# Patient Record
Sex: Female | Born: 1975 | Race: Black or African American | Hispanic: No | Marital: Single | Smoking: Current some day smoker
Health system: Southern US, Community
[De-identification: ages and names within clinical notes are randomized; demographics above are authoritative.]

## PROBLEM LIST (undated history)

## (undated) DIAGNOSIS — M546 Pain in thoracic spine: Secondary | ICD-10-CM

## (undated) DIAGNOSIS — M797 Fibromyalgia: Secondary | ICD-10-CM

## (undated) DIAGNOSIS — F250 Schizoaffective disorder, bipolar type: Secondary | ICD-10-CM

## (undated) DIAGNOSIS — M79673 Pain in unspecified foot: Secondary | ICD-10-CM

## (undated) DIAGNOSIS — M549 Dorsalgia, unspecified: Secondary | ICD-10-CM

## (undated) DIAGNOSIS — IMO0002 Reserved for concepts with insufficient information to code with codable children: Secondary | ICD-10-CM

## (undated) DIAGNOSIS — M5430 Sciatica, unspecified side: Secondary | ICD-10-CM

## (undated) DIAGNOSIS — I2510 Atherosclerotic heart disease of native coronary artery without angina pectoris: Secondary | ICD-10-CM

## (undated) DIAGNOSIS — G43909 Migraine, unspecified, not intractable, without status migrainosus: Secondary | ICD-10-CM

## (undated) DIAGNOSIS — G8929 Other chronic pain: Secondary | ICD-10-CM

## (undated) DIAGNOSIS — D571 Sickle-cell disease without crisis: Secondary | ICD-10-CM

## (undated) DIAGNOSIS — E119 Type 2 diabetes mellitus without complications: Secondary | ICD-10-CM

## (undated) DIAGNOSIS — I10 Essential (primary) hypertension: Secondary | ICD-10-CM

## (undated) DIAGNOSIS — M329 Systemic lupus erythematosus, unspecified: Secondary | ICD-10-CM

## (undated) HISTORY — PX: DILATION AND CURETTAGE, DIAGNOSTIC / THERAPEUTIC: SUR384

## (undated) HISTORY — PX: APPENDECTOMY: SHX54

---

## 2011-08-21 DIAGNOSIS — F32A Depression, unspecified: Secondary | ICD-10-CM | POA: Insufficient documentation

## 2011-08-26 DIAGNOSIS — M545 Low back pain, unspecified: Secondary | ICD-10-CM | POA: Insufficient documentation

## 2011-09-10 DIAGNOSIS — E119 Type 2 diabetes mellitus without complications: Secondary | ICD-10-CM | POA: Insufficient documentation

## 2011-09-10 DIAGNOSIS — R519 Headache, unspecified: Secondary | ICD-10-CM | POA: Insufficient documentation

## 2011-10-08 DIAGNOSIS — E785 Hyperlipidemia, unspecified: Secondary | ICD-10-CM | POA: Insufficient documentation

## 2012-09-24 ENCOUNTER — Encounter (HOSPITAL_COMMUNITY): Payer: Self-pay | Admitting: *Deleted

## 2012-09-24 ENCOUNTER — Emergency Department (HOSPITAL_COMMUNITY)
Admission: EM | Admit: 2012-09-24 | Discharge: 2012-09-24 | Disposition: A | Discharge status: Home / Self Care | Payer: Medicaid - Out of State | Attending: Emergency Medicine | Admitting: Emergency Medicine

## 2012-09-24 DIAGNOSIS — M5416 Radiculopathy, lumbar region: Secondary | ICD-10-CM

## 2012-09-24 DIAGNOSIS — Z880 Allergy status to penicillin: Secondary | ICD-10-CM | POA: Insufficient documentation

## 2012-09-24 DIAGNOSIS — IMO0002 Reserved for concepts with insufficient information to code with codable children: Secondary | ICD-10-CM | POA: Insufficient documentation

## 2012-09-24 DIAGNOSIS — I10 Essential (primary) hypertension: Secondary | ICD-10-CM | POA: Insufficient documentation

## 2012-09-24 DIAGNOSIS — E119 Type 2 diabetes mellitus without complications: Secondary | ICD-10-CM | POA: Insufficient documentation

## 2012-09-24 HISTORY — DX: Type 2 diabetes mellitus without complications: E11.9

## 2012-09-24 HISTORY — DX: Essential (primary) hypertension: I10

## 2012-09-24 MED ORDER — NAPROXEN 500 MG PO TABS: 500.0000 mg | ORAL_TABLET | Freq: Two times a day (BID) | ORAL | Status: DC

## 2012-09-24 MED ORDER — CYCLOBENZAPRINE HCL 10 MG PO TABS
10.0000 mg | ORAL_TABLET | Freq: Once | ORAL | Status: AC
  Administered 2012-09-24: 10 mg via ORAL
  Filled 2012-09-24: qty 1

## 2012-09-24 MED ORDER — CYCLOBENZAPRINE HCL 10 MG PO TABS: 10.0000 mg | ORAL_TABLET | Freq: Three times a day (TID) | ORAL | Status: DC | PRN

## 2012-09-24 MED ORDER — KETOROLAC TROMETHAMINE 60 MG/2ML IM SOLN
60.0000 mg | Freq: Once | INTRAMUSCULAR | Status: AC
  Administered 2012-09-24: 60 mg via INTRAMUSCULAR
  Filled 2012-09-24: qty 2

## 2012-09-24 MED ORDER — HYDROCODONE-ACETAMINOPHEN 5-325 MG PO TABS: ORAL_TABLET | ORAL | Status: DC

## 2012-09-24 MED ORDER — OXYCODONE-ACETAMINOPHEN 5-325 MG PO TABS
1.0000 | ORAL_TABLET | Freq: Once | ORAL | Status: AC
  Administered 2012-09-24: 1 via ORAL
  Filled 2012-09-24: qty 1

## 2012-09-24 NOTE — ED Notes (Signed)
Pt c/o mid back pain. States she went to the bathroom and heard something pop and says she cant stand up straight. Pt is using a cane.

## 2012-09-24 NOTE — ED Notes (Signed)
Pain T spine region, onset this pm , pt was on toilet and when standing had pain in her back and felt a "pop" , alert, Pain from left side of back and down to left leg.

## 2012-09-24 NOTE — ED Provider Notes (Signed)
History     CSN: [id number]  Arrival date & time 09/24/12  2130   First MD Initiated Contact with Patient 09/24/12 2203      Chief Complaint  Patient presents with  . Back Pain    (Consider location/radiation/quality/duration/timing/severity/associated sxs/prior treatment) HPI Comments: Christy Velasquez is a 37 y.o. female who presents to the Emergency Department complaining of left sided low back pain.  States that the pain began suddenly earlier this evening while sitting on the toilet.  Felt a sharp pain to her back that radiates int her left buttocks and down her left leg. Describes the pain as sharp and worse with attempted movement.  Improves with sitting and leaning forward slightly.  She denies known injury, fever, abd pain, dysuria, incontinence of bladder or bowel, perineal pain or numbness.    The history is provided by the patient.    Past Medical History  Diagnosis Date  . Hypertension   . Diabetes mellitus without complication     Past Surgical History  Procedure Laterality Date  . Appendectomy      History reviewed. No pertinent family history.  History  Substance Use Topics  . Smoking status: Never Smoker   . Smokeless tobacco: Not on file  . Alcohol Use: No    OB History   Grav Para Term Preterm Abortions TAB SAB Ect Mult Living                  Review of Systems  Constitutional: Negative for fever.  Respiratory: Negative for chest tightness and shortness of breath.   Cardiovascular: Negative for chest pain.  Gastrointestinal: Negative for nausea, vomiting, abdominal pain, constipation and abdominal distention.  Genitourinary: Negative for dysuria, frequency, hematuria, flank pain, decreased urine volume, vaginal bleeding, vaginal discharge, difficulty urinating and pelvic pain.       No perineal numbness or incontinence of urine or feces  Musculoskeletal: Positive for back pain. Negative for joint swelling.  Skin: Negative for rash and wound.    Neurological: Negative for facial asymmetry, weakness and numbness.  All other systems reviewed and are negative.    Allergies  Penicillins and Shellfish allergy  Home Medications   Current Outpatient Rx  Name  Route  Sig  Dispense  Refill  . acetaminophen (ARTHRITIS PAIN RELIEF) 650 MG CR tablet   Oral   Take 650 mg by mouth every 8 (eight) hours as needed for pain.         Marland Kitchen OVER THE COUNTER MEDICATION   Topical   Apply 1 application topically daily as needed (ANALGESIC CREAM FOR PAIN RELIEF).           BP 127/68  Pulse 89  Temp(Src) 99 F (37.2 C)  Resp 20  Ht 5' 5.5" (1.664 m)  Wt 204 lb (92.534 kg)  BMI 33.42 kg/m2  SpO2 99%  Physical Exam  Nursing note and vitals reviewed. Constitutional: She is oriented to person, place, and time. She appears well-developed and well-nourished. No distress.  HENT:  Head: Normocephalic and atraumatic.  Neck: Normal range of motion. Neck supple.  Cardiovascular: Normal rate, regular rhythm, normal heart sounds and intact distal pulses.   No murmur heard. Pulmonary/Chest: Effort normal and breath sounds normal. No respiratory distress. She exhibits no tenderness.  Abdominal: Soft. She exhibits no distension. There is no tenderness. There is no rebound and no guarding.  Musculoskeletal: She exhibits tenderness. She exhibits no edema.       Lumbar back: She exhibits tenderness and  pain. She exhibits normal range of motion, no swelling, no deformity, no laceration and normal pulse.       Back:  ttp of the left lumbar paraspinal muscles and SI joint.  No spinal tenderness, no CVA tenderness.  DP pulses are brisk and symmetrical.  Distal sensation intact.  Hip Flexors/Extensors are intact  Neurological: She is alert and oriented to person, place, and time. No cranial nerve deficit or sensory deficit. She exhibits normal muscle tone. Coordination and gait normal.  Reflex Scores:      Patellar reflexes are 2+ on the right side and  2+ on the left side.      Achilles reflexes are 2+ on the right side and 2+ on the left side. Skin: Skin is warm and dry.    ED Course  Procedures (including critical care time)  Labs Reviewed - No data to display No results found.      MDM     2255  Patient is feeling better.  Pain has improved.  No focal neuro or motor deficits.  Ambulates well.  Doubt emergent neurological or infectious process.  Likely sciatica with muscle spasm.  Will treat with flexeril, vicodin and naprosyn.  VSS.  Patient has appt with her PMD next week.  Doubt emergent neurological or infectious process.       Christy Velasquez L. Trisha Mangle, PA-C 09/25/12 0037

## 2012-09-25 NOTE — ED Provider Notes (Signed)
Medical screening examination/treatment/procedure(s) were performed by non-physician practitioner and as supervising physician I was immediately available for consultation/collaboration.  Alaina Donati, MD 09/25/12 1510 

## 2012-10-05 DIAGNOSIS — G43909 Migraine, unspecified, not intractable, without status migrainosus: Secondary | ICD-10-CM | POA: Insufficient documentation

## 2012-12-05 ENCOUNTER — Emergency Department (HOSPITAL_COMMUNITY)
Admission: EM | Admit: 2012-12-05 | Discharge: 2012-12-05 | Disposition: A | Discharge status: Home / Self Care | Payer: Medicaid - Out of State | Attending: Emergency Medicine | Admitting: Emergency Medicine

## 2012-12-05 ENCOUNTER — Encounter (HOSPITAL_COMMUNITY): Payer: Self-pay | Admitting: Emergency Medicine

## 2012-12-05 ENCOUNTER — Emergency Department (HOSPITAL_COMMUNITY): Payer: Medicaid - Out of State

## 2012-12-05 DIAGNOSIS — I10 Essential (primary) hypertension: Secondary | ICD-10-CM | POA: Insufficient documentation

## 2012-12-05 DIAGNOSIS — Z87828 Personal history of other (healed) physical injury and trauma: Secondary | ICD-10-CM | POA: Insufficient documentation

## 2012-12-05 DIAGNOSIS — M79671 Pain in right foot: Secondary | ICD-10-CM

## 2012-12-05 DIAGNOSIS — M25579 Pain in unspecified ankle and joints of unspecified foot: Secondary | ICD-10-CM | POA: Insufficient documentation

## 2012-12-05 DIAGNOSIS — Z8739 Personal history of other diseases of the musculoskeletal system and connective tissue: Secondary | ICD-10-CM | POA: Insufficient documentation

## 2012-12-05 DIAGNOSIS — G43909 Migraine, unspecified, not intractable, without status migrainosus: Secondary | ICD-10-CM | POA: Insufficient documentation

## 2012-12-05 DIAGNOSIS — E119 Type 2 diabetes mellitus without complications: Secondary | ICD-10-CM | POA: Insufficient documentation

## 2012-12-05 DIAGNOSIS — Z79899 Other long term (current) drug therapy: Secondary | ICD-10-CM | POA: Insufficient documentation

## 2012-12-05 DIAGNOSIS — F17200 Nicotine dependence, unspecified, uncomplicated: Secondary | ICD-10-CM | POA: Insufficient documentation

## 2012-12-05 DIAGNOSIS — Z880 Allergy status to penicillin: Secondary | ICD-10-CM | POA: Insufficient documentation

## 2012-12-05 HISTORY — DX: Sciatica, unspecified side: M54.30

## 2012-12-05 HISTORY — DX: Migraine, unspecified, not intractable, without status migrainosus: G43.909

## 2012-12-05 MED ORDER — NAPROXEN 500 MG PO TABS: 500.0000 mg | ORAL_TABLET | Freq: Two times a day (BID) | ORAL | Status: DC

## 2012-12-05 MED ORDER — TRAMADOL HCL 50 MG PO TABS: 50.0000 mg | ORAL_TABLET | Freq: Four times a day (QID) | ORAL | Status: DC | PRN
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
## 2012-12-05 NOTE — ED Provider Notes (Signed)
CSN: [id number]     Arrival date & time 12/05/12  1110 History   First MD Initiated Contact with Patient 12/05/12 1133     Chief Complaint  Patient presents with  . Foot Pain   (Consider location/radiation/quality/duration/timing/severity/associated sxs/prior Treatment) Patient is a 37 y.o. female presenting with lower extremity pain. The history is provided by the patient.  Foot Pain This is a chronic problem. The current episode started more than 1 month ago. The problem occurs constantly. The problem has been unchanged. Associated symptoms include arthralgias. Pertinent negatives include no chills, fever, headaches, joint swelling, myalgias, nausea, neck pain, numbness, rash, sore throat, vomiting or weakness. The symptoms are aggravated by standing and walking. She has tried NSAIDs and acetaminophen for the symptoms. The treatment provided no relief.    Past Medical History  Diagnosis Date  . Hypertension   . Diabetes mellitus without complication   . Migraines   . Sciatica    Past Surgical History  Procedure Laterality Date  . Appendectomy    . Dilation and curettage, diagnostic / therapeutic     Family History  Problem Relation Age of Onset  . Diabetes Mother   . Heart failure Mother   . Hypertension Mother   . Diabetes Brother   . Diabetes Other   . Heart failure Other    History  Substance Use Topics  . Smoking status: Current Some Day Smoker -- 0.10 packs/day    Types: Cigarettes  . Smokeless tobacco: Not on file  . Alcohol Use: No   OB History   Grav Para Term Preterm Abortions TAB SAB Ect Mult Living   2 1 1  1  1   1      Review of Systems  Constitutional: Negative for fever and chills.  HENT: Negative for sore throat and neck pain.   Gastrointestinal: Negative for nausea and vomiting.  Genitourinary: Negative for dysuria and difficulty urinating.  Musculoskeletal: Positive for arthralgias. Negative for myalgias and joint swelling.  Skin: Negative for  color change, rash and wound.  Neurological: Negative for weakness, numbness and headaches.  All other systems reviewed and are negative.    Allergies  Penicillins and Shellfish allergy  Home Medications   Current Outpatient Rx  Name  Route  Sig  Dispense  Refill  . metFORMIN (GLUCOPHAGE) 500 MG tablet   Oral   Take 500 mg by mouth 2 (two) times daily with a meal.         . topiramate (TOPAMAX) 50 MG tablet   Oral   Take 10 mg by mouth 2 (two) times daily.         Marland Kitchen acetaminophen (ARTHRITIS PAIN RELIEF) 650 MG CR tablet   Oral   Take 650 mg by mouth every 8 (eight) hours as needed for pain.         . cyclobenzaprine (FLEXERIL) 10 MG tablet   Oral   Take 1 tablet (10 mg total) by mouth 3 (three) times daily as needed for muscle spasms.   21 tablet   0   . HYDROcodone-acetaminophen (NORCO/VICODIN) 5-325 MG per tablet      Take one-two tabs po q 4-6 hrs prn pain   20 tablet   0   . naproxen (NAPROSYN) 500 MG tablet   Oral   Take 1 tablet (500 mg total) by mouth 2 (two) times daily.   20 tablet   0   . OVER THE COUNTER MEDICATION   Topical   Apply 1  application topically daily as needed (ANALGESIC CREAM FOR PAIN RELIEF).          BP 144/94  Pulse 87  Temp(Src) 97.9 F (36.6 C) (Oral)  Resp 20  Ht 5' 5.6" (1.666 m)  Wt 208 lb (94.348 kg)  BMI 33.99 kg/m2  SpO2 100% Physical Exam  Nursing note and vitals reviewed. Constitutional: She is oriented to person, place, and time. She appears well-developed and well-nourished. No distress.  HENT:  Head: Normocephalic and atraumatic.  Cardiovascular: Normal rate, regular rhythm, normal heart sounds and intact distal pulses.   No murmur heard. Pulmonary/Chest: Effort normal and breath sounds normal. No respiratory distress.  Musculoskeletal: She exhibits tenderness. She exhibits no edema.  ttp of the dorsal and plantar surface of mid right foot.  ROM is preserved.  DP pulse is brisk,distal sensation intact.   No erythema, edema, bruising or bony deformity.  No proximal tenderness.  Neurological: She is alert and oriented to person, place, and time. She exhibits normal muscle tone. Coordination normal.  Skin: Skin is warm and dry.    ED Course  Procedures (including critical care time) Labs Review Labs Reviewed - No data to display Imaging Review Dg Foot Complete Right  12/05/2012   *RADIOLOGY REPORT*  Clinical Data: Pain  RIGHT FOOT COMPLETE - 3+ VIEW  Comparison: None.  Findings: No acute fracture or dislocation is noted.  Mild hallux valgus deformity is seen.  No gross soft tissue changes are noted.  IMPRESSION: No acute abnormalities seen.   Original Report Authenticated By: Alcide Clever, M.D.    MDM    Crutches, post op shoe applied.  Pain improved, remains NV intact  Referral given for orthopedics.    Leatta Alewine L. Trisha Mangle, PA-C 12/09/12 1727

## 2012-12-07 ENCOUNTER — Telehealth: Payer: Self-pay | Admitting: Orthopedic Surgery

## 2012-12-07 NOTE — Telephone Encounter (Signed)
Patient called following visit to [facility] Emergency Room for foot problem (no fracture).  Reviewed instructions per physician at Emergency department visit; patient's insurance is out of state Medicaid, which I explained we are non-network provider, therefore, unable to schedule and file insurance, as it would be non-covered.  Gave options to patient of [place] orthopedic providers, which she will contact.

## 2012-12-10 NOTE — ED Provider Notes (Signed)
Medical screening examination/treatment/procedure(s) were performed by non-physician practitioner and as supervising physician I was immediately available for consultation/collaboration.   Zakiyyah Savannah L Zulay Corrie, MD 12/10/12 1542 

## 2013-07-07 DIAGNOSIS — Z0000 Encounter for general adult medical examination without abnormal findings: Secondary | ICD-10-CM | POA: Insufficient documentation

## 2013-11-19 ENCOUNTER — Encounter (HOSPITAL_COMMUNITY): Payer: Self-pay | Admitting: Emergency Medicine

## 2013-11-19 ENCOUNTER — Emergency Department (HOSPITAL_COMMUNITY)
Admission: EM | Admit: 2013-11-19 | Discharge: 2013-11-19 | Disposition: A | Discharge status: Home / Self Care | Payer: Medicaid - Out of State | Attending: Emergency Medicine | Admitting: Emergency Medicine

## 2013-11-19 DIAGNOSIS — E669 Obesity, unspecified: Secondary | ICD-10-CM | POA: Diagnosis not present

## 2013-11-19 DIAGNOSIS — Z791 Long term (current) use of non-steroidal anti-inflammatories (NSAID): Secondary | ICD-10-CM | POA: Insufficient documentation

## 2013-11-19 DIAGNOSIS — M545 Low back pain, unspecified: Secondary | ICD-10-CM | POA: Diagnosis not present

## 2013-11-19 DIAGNOSIS — G43909 Migraine, unspecified, not intractable, without status migrainosus: Secondary | ICD-10-CM | POA: Diagnosis not present

## 2013-11-19 DIAGNOSIS — Z79899 Other long term (current) drug therapy: Secondary | ICD-10-CM | POA: Insufficient documentation

## 2013-11-19 DIAGNOSIS — M546 Pain in thoracic spine: Secondary | ICD-10-CM | POA: Diagnosis not present

## 2013-11-19 DIAGNOSIS — I10 Essential (primary) hypertension: Secondary | ICD-10-CM | POA: Insufficient documentation

## 2013-11-19 DIAGNOSIS — G8929 Other chronic pain: Secondary | ICD-10-CM | POA: Diagnosis not present

## 2013-11-19 DIAGNOSIS — Z862 Personal history of diseases of the blood and blood-forming organs and certain disorders involving the immune mechanism: Secondary | ICD-10-CM | POA: Diagnosis not present

## 2013-11-19 DIAGNOSIS — Z880 Allergy status to penicillin: Secondary | ICD-10-CM | POA: Insufficient documentation

## 2013-11-19 DIAGNOSIS — F17200 Nicotine dependence, unspecified, uncomplicated: Secondary | ICD-10-CM | POA: Diagnosis not present

## 2013-11-19 DIAGNOSIS — E119 Type 2 diabetes mellitus without complications: Secondary | ICD-10-CM | POA: Diagnosis not present

## 2013-11-19 HISTORY — DX: Systemic lupus erythematosus, unspecified: M32.9

## 2013-11-19 HISTORY — DX: Reserved for concepts with insufficient information to code with codable children: IMO0002

## 2013-11-19 HISTORY — DX: Sickle-cell disease without crisis: D57.1

## 2013-11-19 MED ORDER — OXYCODONE-ACETAMINOPHEN 5-325 MG PO TABS: ORAL_TABLET | ORAL | Status: DC

## 2013-11-19 MED ORDER — KETOROLAC TROMETHAMINE 60 MG/2ML IM SOLN
30.0000 mg | Freq: Once | INTRAMUSCULAR | Status: AC
  Administered 2013-11-19: 30 mg via INTRAMUSCULAR
  Filled 2013-11-19: qty 2

## 2013-11-19 MED ORDER — OXYCODONE-ACETAMINOPHEN 5-325 MG PO TABS
1.0000 | ORAL_TABLET | Freq: Once | ORAL | Status: AC
  Administered 2013-11-19: 1 via ORAL
  Filled 2013-11-19: qty 1

## 2013-11-19 NOTE — Discharge Instructions (Signed)
Take percocet for breakthrough pain, do not drink alcohol, drive, care for children or do other critical tasks while taking percocet. ° °Please follow with your primary care doctor in the next 2 days for a check-up. They must obtain records for further management.  ° °Do not hesitate to return to the Emergency Department for any new, worsening or concerning symptoms.  ° °
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
## 2013-11-20 NOTE — ED Provider Notes (Signed)
Medical screening examination/treatment/procedure(s) were performed by non-physician practitioner and as supervising physician I was immediately available for consultation/collaboration.    Jatavia Keltner, MD 11/20/13 0020 

## 2014-01-27 ENCOUNTER — Emergency Department (HOSPITAL_COMMUNITY)
Admission: EM | Admit: 2014-01-27 | Discharge: 2014-01-27 | Disposition: A | Discharge status: Home / Self Care | Payer: Medicaid - Out of State | Attending: Emergency Medicine | Admitting: Emergency Medicine

## 2014-01-27 ENCOUNTER — Emergency Department (HOSPITAL_COMMUNITY): Payer: Medicaid - Out of State

## 2014-01-27 ENCOUNTER — Encounter (HOSPITAL_COMMUNITY): Payer: Self-pay | Admitting: Emergency Medicine

## 2014-01-27 DIAGNOSIS — E119 Type 2 diabetes mellitus without complications: Secondary | ICD-10-CM | POA: Diagnosis not present

## 2014-01-27 DIAGNOSIS — M3210 Systemic lupus erythematosus, organ or system involvement unspecified: Secondary | ICD-10-CM | POA: Diagnosis not present

## 2014-01-27 DIAGNOSIS — R51 Headache: Secondary | ICD-10-CM | POA: Diagnosis present

## 2014-01-27 DIAGNOSIS — G43909 Migraine, unspecified, not intractable, without status migrainosus: Secondary | ICD-10-CM | POA: Diagnosis not present

## 2014-01-27 DIAGNOSIS — Z862 Personal history of diseases of the blood and blood-forming organs and certain disorders involving the immune mechanism: Secondary | ICD-10-CM | POA: Diagnosis not present

## 2014-01-27 DIAGNOSIS — R52 Pain, unspecified: Secondary | ICD-10-CM

## 2014-01-27 DIAGNOSIS — M329 Systemic lupus erythematosus, unspecified: Secondary | ICD-10-CM

## 2014-01-27 DIAGNOSIS — Z880 Allergy status to penicillin: Secondary | ICD-10-CM | POA: Insufficient documentation

## 2014-01-27 DIAGNOSIS — Z720 Tobacco use: Secondary | ICD-10-CM | POA: Insufficient documentation

## 2014-01-27 DIAGNOSIS — R519 Headache, unspecified: Secondary | ICD-10-CM

## 2014-01-27 DIAGNOSIS — G8929 Other chronic pain: Secondary | ICD-10-CM | POA: Insufficient documentation

## 2014-01-27 DIAGNOSIS — Z791 Long term (current) use of non-steroidal anti-inflammatories (NSAID): Secondary | ICD-10-CM | POA: Diagnosis not present

## 2014-01-27 DIAGNOSIS — I10 Essential (primary) hypertension: Secondary | ICD-10-CM | POA: Diagnosis not present

## 2014-01-27 DIAGNOSIS — Z79899 Other long term (current) drug therapy: Secondary | ICD-10-CM | POA: Insufficient documentation

## 2014-01-27 HISTORY — DX: Other chronic pain: G89.29

## 2014-01-27 HISTORY — DX: Dorsalgia, unspecified: M54.9

## 2014-01-27 HISTORY — DX: Pain in thoracic spine: M54.6

## 2014-01-27 HISTORY — DX: Pain in unspecified foot: M79.673

## 2014-01-27 LAB — CBC WITH DIFFERENTIAL/PLATELET
BASOS PCT: 0 % (ref 0–1)
Basophils Absolute: 0.1 10*3/uL (ref 0.0–0.1)
EOS ABS: 0.4 10*3/uL (ref 0.0–0.7)
EOS PCT: 2 % (ref 0–5)
HEMATOCRIT: 38.1 % (ref 36.0–46.0)
HEMOGLOBIN: 12.9 g/dL (ref 12.0–15.0)
Lymphocytes Relative: 25 % (ref 12–46)
Lymphs Abs: 4.4 10*3/uL — ABNORMAL HIGH (ref 0.7–4.0)
MCH: 29.8 pg (ref 26.0–34.0)
MCHC: 33.9 g/dL (ref 30.0–36.0)
MCV: 88 fL (ref 78.0–100.0)
MONO ABS: 0.7 10*3/uL (ref 0.1–1.0)
MONOS PCT: 4 % (ref 3–12)
Neutro Abs: 12.3 10*3/uL — ABNORMAL HIGH (ref 1.7–7.7)
Neutrophils Relative %: 69 % (ref 43–77)
Platelets: 439 10*3/uL — ABNORMAL HIGH (ref 150–400)
RBC: 4.33 MIL/uL (ref 3.87–5.11)
RDW: 14.5 % (ref 11.5–15.5)
WBC: 17.8 10*3/uL — ABNORMAL HIGH (ref 4.0–10.5)

## 2014-01-27 LAB — COMPREHENSIVE METABOLIC PANEL
ALBUMIN: 4.2 g/dL (ref 3.5–5.2)
ALT: 13 U/L (ref 0–35)
ANION GAP: 14 (ref 5–15)
AST: 13 U/L (ref 0–37)
Alkaline Phosphatase: 77 U/L (ref 39–117)
BILIRUBIN TOTAL: 0.2 mg/dL — AB (ref 0.3–1.2)
BUN: 12 mg/dL (ref 6–23)
CALCIUM: 9.6 mg/dL (ref 8.4–10.5)
CHLORIDE: 104 meq/L (ref 96–112)
CO2: 22 mEq/L (ref 19–32)
CREATININE: 0.75 mg/dL (ref 0.50–1.10)
GFR calc Af Amer: >90 mL/min (ref >90)
GFR calc non Af Amer: >90 mL/min (ref >90)
Glucose, Bld: 91 mg/dL (ref 70–99)
Potassium: 4.4 mEq/L (ref 3.7–5.3)
Sodium: 140 mEq/L (ref 137–147)
TOTAL PROTEIN: 8.3 g/dL (ref 6.0–8.3)

## 2014-01-27 LAB — SEDIMENTATION RATE: SED RATE: 20 mm/h (ref 0–22)

## 2014-01-27 MED ORDER — METHYLPREDNISOLONE SODIUM SUCC 125 MG IJ SOLR
125.0000 mg | Freq: Once | INTRAMUSCULAR | Status: AC
  Administered 2014-01-27: 125 mg via INTRAVENOUS
  Filled 2014-01-27: qty 2

## 2014-01-27 MED ORDER — KETOROLAC TROMETHAMINE 30 MG/ML IJ SOLN
30.0000 mg | Freq: Once | INTRAMUSCULAR | Status: AC
  Administered 2014-01-27: 30 mg via INTRAVENOUS
  Filled 2014-01-27: qty 1

## 2014-01-27 MED ORDER — PROMETHAZINE HCL 25 MG PO TABS: 25.0000 mg | ORAL_TABLET | Freq: Four times a day (QID) | ORAL | Status: DC | PRN

## 2014-01-27 MED ORDER — HYDROMORPHONE HCL 1 MG/ML IJ SOLN: 1.0000 mg | Freq: Once | INTRAMUSCULAR | Status: DC

## 2014-01-27 MED ORDER — METOCLOPRAMIDE HCL 5 MG/ML IJ SOLN: 10.0000 mg | Freq: Once | INTRAMUSCULAR | Status: DC

## 2014-01-27 MED ORDER — GADOBENATE DIMEGLUMINE 529 MG/ML IV SOLN
20.0000 mL | Freq: Once | INTRAVENOUS | Status: AC | PRN
  Administered 2014-01-27: 20 mL via INTRAVENOUS

## 2014-01-27 MED ORDER — DIPHENHYDRAMINE HCL 50 MG/ML IJ SOLN: 50.0000 mg | Freq: Once | INTRAMUSCULAR | Status: DC

## 2014-01-27 MED ORDER — HYDROCODONE-ACETAMINOPHEN 5-325 MG PO TABS: 1.0000 | ORAL_TABLET | ORAL | Status: DC | PRN

## 2014-01-27 MED ORDER — PREDNISONE 50 MG PO TABS: ORAL_TABLET | ORAL | Status: DC

## 2014-01-27 MED ORDER — HYDROMORPHONE HCL 1 MG/ML IJ SOLN
1.0000 mg | Freq: Once | INTRAMUSCULAR | Status: AC
Start: 2014-01-27 — End: 2014-01-27
  Administered 2014-01-27: 1 mg via INTRAMUSCULAR
  Filled 2014-01-27: qty 1

## 2014-01-27 NOTE — ED Notes (Signed)
Pt fell to sleep after IV initiated with snore present. Pt has not yet been medicated. To speak with EDP before medicating at this time.

## 2014-01-27 NOTE — ED Notes (Signed)
[  t states she is much improved and very happy with her care here. Still rates her facial pain @ 6/10 but is comfortable with home care. Left via w/c with family

## 2014-01-27 NOTE — ED Provider Notes (Signed)
CSN: [id number]     Arrival date & time 01/27/14  1052 History   First MD Initiated Contact with Patient 01/27/14 1336     This chart was scribed for Benny LennertJoseph L Vegas Coffin, MD by Arlan OrganAshley Leger, ED Scribe. This patient was seen in room APA15/[facility]15 and the patient's care was started 1:40 PM.   Chief Complaint  Patient presents with  . Lupus   Patient is a 38 y.o. female presenting with general illness. The history is provided by the patient. No language interpreter was used.  Illness Location:  L sided forhead and L cheek Severity:  Severe Onset quality:  Sudden Timing:  Constant Progression:  Unchanged Chronicity:  Recurrent Relieved by:  Nothing Worsened by:  Nothing Ineffective treatments:  Nothing Associated symptoms: no abdominal pain, no chest pain, no congestion, no cough, no diarrhea, no fatigue, no headaches and no rash   Risk factors:  History of Lupus   HPI Comments: Christy Velasquez is a 38 y.o. female with a PMHx of HTN, DM, lupus, and sickle cell anemia who presents to the Emergency Department complaining of constant, moderate facial pain onset early this morning. Pt also reports generalized body aches at this time. Currently pain is rated 10/10. Christy Velasquez states this kind of pain is typically what she experiences throughout her body with her hx of Lupus. However, she has never experienced this pain to her face. PCP has prescribed Oxycodone and OxyContin for break through pain but she has run out of both prescriptions. No other associated symptoms at this time. Pt with known allergy to Penicillins. No other concerns this visit.   Past Medical History  Diagnosis Date  . Hypertension   . Diabetes mellitus without complication   . Migraines   . Sciatica   . Lupus   . Sickle cell anemia   . Chronic back pain   . Chronic mid back pain   . Chronic foot pain    Past Surgical History  Procedure Laterality Date  . Appendectomy    . Dilation and curettage, diagnostic /  therapeutic     Family History  Problem Relation Age of Onset  . Diabetes Mother   . Heart failure Mother   . Hypertension Mother   . Diabetes Brother   . Diabetes Other   . Heart failure Other    History  Substance Use Topics  . Smoking status: Current Some Day Smoker -- 0.10 packs/day    Types: Cigarettes  . Smokeless tobacco: Not on file  . Alcohol Use: No   OB History   Grav Para Term Preterm Abortions TAB SAB Ect Mult Living   2 1 1  1  1   1      Review of Systems  Constitutional: Negative for appetite change and fatigue.  HENT: Negative for congestion, ear discharge and sinus pressure.   Eyes: Negative for discharge.  Respiratory: Negative for cough.   Cardiovascular: Negative for chest pain.  Gastrointestinal: Negative for abdominal pain and diarrhea.  Genitourinary: Negative for frequency and hematuria.  Musculoskeletal: Positive for arthralgias. Negative for back pain.  Skin: Negative for rash.  Neurological: Negative for seizures and headaches.  Psychiatric/Behavioral: Negative for hallucinations.      Allergies  Penicillins and Shellfish allergy  Home Medications   Prior to Admission medications   Medication Sig Start Date End Date Taking? Authorizing Provider  DULoxetine (CYMBALTA) 60 MG capsule Take 60 mg by mouth 2 (two) times daily.   Yes Historical Provider, MD  HYDROcodone-acetaminophen (NORCO) 10-325 MG per tablet Take 1 tablet by mouth every 6 (six) hours as needed. Pain   Yes Historical Provider, MD  meloxicam (MOBIC) 15 MG tablet Take 15 mg by mouth daily.   Yes Historical Provider, MD  metFORMIN (GLUCOPHAGE) 500 MG tablet Take 500 mg by mouth 2 (two) times daily with a meal.   Yes Historical Provider, MD  ranitidine (ZANTAC) 150 MG tablet Take 150 mg by mouth 2 (two) times daily.   Yes Historical Provider, MD  topiramate (TOPAMAX) 50 MG tablet Take 50 mg by mouth 2 (two) times daily.    Yes Historical Provider, MD   Triage Vitals: BP 131/83   Pulse 74  Temp(Src) 98.7 F (37.1 C) (Oral)  Resp 12  Ht 5\' 5"  (1.651 m)  Wt 220 lb (99.791 kg)  BMI 36.61 kg/m2  SpO2 100%   Physical Exam  Constitutional: She is oriented to person, place, and time. She appears well-developed.  HENT:  Head: Normocephalic.  Pt tender over left temporal area,   Neck supple  Eyes: Conjunctivae and EOM are normal. No scleral icterus.  Neck: Neck supple. No thyromegaly present.  Cardiovascular: Normal rate, regular rhythm and normal heart sounds.  Exam reveals no gallop and no friction rub.   No murmur heard. Pulmonary/Chest: No stridor. She has no wheezes. She has no rales. She exhibits no tenderness.  Abdominal: She exhibits no distension. There is no tenderness. There is no rebound.  Musculoskeletal: Normal range of motion. She exhibits tenderness. She exhibits no edema.  Tenderness to L sided forehead   Lymphadenopathy:    She has no cervical adenopathy.  Neurological: She is oriented to person, place, and time. She exhibits normal muscle tone. Coordination normal.  Skin: No rash noted. No erythema.  Psychiatric: She has a normal mood and affect. Her behavior is normal.    ED Course  Procedures (including critical care time)  DIAGNOSTIC STUDIES: Oxygen Saturation is 100% on RA, Normal by my interpretation.    COORDINATION OF CARE: 1:46 PM-Discussed treatment plan with pt at bedside and pt agreed to plan.     Labs Review Labs Reviewed - No data to display  Imaging Review No results found.   EKG Interpretation None      MDM   Final diagnoses:  None     I personally performed the services described in this documentation, which was scribed in my presence. The recorded information has been reviewed and is accurate.  The chart was scribed for me under my direct supervision.  I personally performed the history, physical, and medical decision making and all procedures in the evaluation of this patient.Benny Lennert.   Domanic Matusek L Marc Leichter,  MD 01/31/14 [phone]

## 2014-01-27 NOTE — ED Notes (Signed)
Pt has lupus, usually sees PCP for pain, PCP closed. Pt co generalized body pain, 10/10.

## 2014-01-27 NOTE — ED Provider Notes (Signed)
MRI, MRA brain, ESR all normal.  Patient rechecked at 1830. Normal neurological exam. Reduced headache pain. No stiff neck. Discharge medications prednisone 50 mg, Norco, Phenergan 25 mg  Nat Christen, MD 01/27/14 1859

## 2014-01-27 NOTE — ED Notes (Signed)
Spoke with Dr. Estell HarpinZammit, order to hold meds at this time.

## 2014-01-27 NOTE — Discharge Instructions (Signed)
Brain scan showed no serious findings.   Prescriptions for prednisone, pain medicine, nausea medicine.

## 2014-01-27 NOTE — ED Notes (Signed)
Pt states that her pain is "a little better" but still hurting in her face.

## 2014-02-06 ENCOUNTER — Encounter (HOSPITAL_COMMUNITY): Payer: Self-pay | Admitting: Emergency Medicine

## 2014-02-28 DIAGNOSIS — K219 Gastro-esophageal reflux disease without esophagitis: Secondary | ICD-10-CM | POA: Insufficient documentation

## 2014-11-27 ENCOUNTER — Emergency Department (HOSPITAL_COMMUNITY): Payer: Medicaid - Out of State

## 2014-11-27 ENCOUNTER — Encounter (HOSPITAL_COMMUNITY): Payer: Self-pay | Admitting: *Deleted

## 2014-11-27 ENCOUNTER — Emergency Department (HOSPITAL_COMMUNITY)
Admission: EM | Admit: 2014-11-27 | Discharge: 2014-11-27 | Disposition: A | Discharge status: Home / Self Care | Payer: Medicaid - Out of State | Attending: Emergency Medicine | Admitting: Emergency Medicine

## 2014-11-27 DIAGNOSIS — I10 Essential (primary) hypertension: Secondary | ICD-10-CM | POA: Insufficient documentation

## 2014-11-27 DIAGNOSIS — Z79899 Other long term (current) drug therapy: Secondary | ICD-10-CM | POA: Insufficient documentation

## 2014-11-27 DIAGNOSIS — G8929 Other chronic pain: Secondary | ICD-10-CM | POA: Insufficient documentation

## 2014-11-27 DIAGNOSIS — M545 Low back pain: Secondary | ICD-10-CM | POA: Diagnosis present

## 2014-11-27 DIAGNOSIS — Z3202 Encounter for pregnancy test, result negative: Secondary | ICD-10-CM | POA: Diagnosis not present

## 2014-11-27 DIAGNOSIS — M5432 Sciatica, left side: Secondary | ICD-10-CM | POA: Diagnosis not present

## 2014-11-27 DIAGNOSIS — E119 Type 2 diabetes mellitus without complications: Secondary | ICD-10-CM | POA: Insufficient documentation

## 2014-11-27 DIAGNOSIS — Z880 Allergy status to penicillin: Secondary | ICD-10-CM | POA: Diagnosis not present

## 2014-11-27 DIAGNOSIS — Z791 Long term (current) use of non-steroidal anti-inflammatories (NSAID): Secondary | ICD-10-CM | POA: Diagnosis not present

## 2014-11-27 DIAGNOSIS — Z862 Personal history of diseases of the blood and blood-forming organs and certain disorders involving the immune mechanism: Secondary | ICD-10-CM | POA: Diagnosis not present

## 2014-11-27 DIAGNOSIS — Z720 Tobacco use: Secondary | ICD-10-CM | POA: Diagnosis not present

## 2014-11-27 LAB — URINALYSIS, ROUTINE W REFLEX MICROSCOPIC
BILIRUBIN URINE: NEGATIVE
Glucose, UA: NEGATIVE mg/dL
KETONES UR: NEGATIVE mg/dL
NITRITE: NEGATIVE
PH: 5.5 (ref 5.0–8.0)
Protein, ur: NEGATIVE mg/dL
SPECIFIC GRAVITY, URINE: 1.025 (ref 1.005–1.030)
UROBILINOGEN UA: 0.2 mg/dL (ref 0.0–1.0)

## 2014-11-27 LAB — URINE MICROSCOPIC-ADD ON

## 2014-11-27 LAB — POC URINE PREG, ED: PREG TEST UR: NEGATIVE

## 2014-11-27 MED ORDER — CYCLOBENZAPRINE HCL 10 MG PO TABS: 10.0000 mg | ORAL_TABLET | Freq: Three times a day (TID) | ORAL | Status: DC | PRN

## 2014-11-27 MED ORDER — NAPROXEN 500 MG PO TABS: 500.0000 mg | ORAL_TABLET | Freq: Two times a day (BID) | ORAL | Status: DC

## 2014-11-27 MED ORDER — OXYCODONE-ACETAMINOPHEN 5-325 MG PO TABS: 1.0000 | ORAL_TABLET | ORAL | Status: DC | PRN

## 2014-11-27 NOTE — Discharge Instructions (Signed)
Sciatica °Sciatica is pain, weakness, numbness, or tingling along your sciatic nerve. The nerve starts in the lower back and runs down the back of each leg. Nerve damage or certain conditions pinch or put pressure on the sciatic nerve. This causes the pain, weakness, and other discomforts of sciatica. °HOME CARE  °· Only take medicine as told by your doctor. °· Apply ice to the affected area for 20 minutes. Do this 3-4 times a day for the first 48-72 hours. Then try heat in the same way. °· Exercise, stretch, or do your usual activities if these do not make your pain worse. °· Go to physical therapy as told by your doctor. °· Keep all doctor visits as told. °· Do not wear high heels or shoes that are not supportive. °· Get a firm mattress if your mattress is too soft to lessen pain and discomfort. °GET HELP RIGHT AWAY IF:  °· You cannot control when you poop (bowel movement) or pee (urinate). °· You have more weakness in your lower back, lower belly (pelvis), butt (buttocks), or legs. °· You have redness or puffiness (swelling) of your back. °· You have a burning feeling when you pee. °· You have pain that gets worse when you lie down. °· You have pain that wakes you from your sleep. °· Your pain is worse than past pain. °· Your pain lasts longer than 4 weeks. °· You are suddenly losing weight without reason. °MAKE SURE YOU:  °· Understand these instructions. °· Will watch this condition. °· Will get help right away if you are not doing well or get worse. °Document Released: 01/01/2008 Document Revised: 09/23/2011 Document Reviewed: 08/03/2011 °[employer]® Patient Information ©2015 [employer]. This information is not intended to replace advice given to you by your health care provider. Make sure you discuss any questions you have with your health care provider. ° °
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
## 2014-11-29 LAB — URINE CULTURE
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
## 2020-08-28 DIAGNOSIS — Z8639 Personal history of other endocrine, nutritional and metabolic disease: Secondary | ICD-10-CM | POA: Insufficient documentation

## 2020-08-28 DIAGNOSIS — F339 Major depressive disorder, recurrent, unspecified: Secondary | ICD-10-CM | POA: Insufficient documentation

## 2020-08-28 DIAGNOSIS — M797 Fibromyalgia: Secondary | ICD-10-CM | POA: Insufficient documentation

## 2021-04-10 ENCOUNTER — Other Ambulatory Visit: Payer: Self-pay

## 2021-04-10 ENCOUNTER — Emergency Department (HOSPITAL_COMMUNITY): Payer: Medicaid - Out of State

## 2021-04-10 ENCOUNTER — Encounter (HOSPITAL_COMMUNITY): Payer: Self-pay

## 2021-04-10 ENCOUNTER — Emergency Department (HOSPITAL_COMMUNITY)
Admission: EM | Admit: 2021-04-10 | Discharge: 2021-04-11 | Disposition: A | Discharge status: Home / Self Care | Payer: Medicaid - Out of State | Attending: Emergency Medicine | Admitting: Emergency Medicine

## 2021-04-10 DIAGNOSIS — R0789 Other chest pain: Secondary | ICD-10-CM | POA: Diagnosis not present

## 2021-04-10 DIAGNOSIS — R0602 Shortness of breath: Secondary | ICD-10-CM | POA: Insufficient documentation

## 2021-04-10 DIAGNOSIS — R0609 Other forms of dyspnea: Secondary | ICD-10-CM

## 2021-04-10 DIAGNOSIS — R079 Chest pain, unspecified: Secondary | ICD-10-CM | POA: Diagnosis present

## 2021-04-10 LAB — CBC
HCT: 45.4 % (ref 36.0–46.0)
Hemoglobin: 15.5 g/dL — ABNORMAL HIGH (ref 12.0–15.0)
MCH: 30.9 pg (ref 26.0–34.0)
MCHC: 34.1 g/dL (ref 30.0–36.0)
MCV: 90.4 fL (ref 80.0–100.0)
Platelets: 395 10*3/uL (ref 150–400)
RBC: 5.02 MIL/uL (ref 3.87–5.11)
RDW: 15.2 % (ref 11.5–15.5)
WBC: 20 10*3/uL — ABNORMAL HIGH (ref 4.0–10.5)
nRBC: 0 % (ref 0.0–0.2)

## 2021-04-10 LAB — PROTIME-INR
INR: 1 (ref 0.8–1.2)
Prothrombin Time: 12.8 seconds (ref 11.4–15.2)

## 2021-04-10 LAB — TROPONIN I (HIGH SENSITIVITY)
Troponin I (High Sensitivity): <2 ng/L (ref <18)
Troponin I (High Sensitivity): <2 ng/L (ref <18)

## 2021-04-10 LAB — BASIC METABOLIC PANEL
Anion gap: 11 (ref 5–15)
BUN: 18 mg/dL (ref 6–20)
CO2: 24 mmol/L (ref 22–32)
Calcium: 9.2 mg/dL (ref 8.9–10.3)
Chloride: 102 mmol/L (ref 98–111)
Creatinine, Ser: 0.74 mg/dL (ref 0.44–1.00)
GFR, Estimated: >60 mL/min (ref >60)
Glucose, Bld: 237 mg/dL — ABNORMAL HIGH (ref 70–99)
Potassium: 3.9 mmol/L (ref 3.5–5.1)
Sodium: 137 mmol/L (ref 135–145)

## 2021-04-10 LAB — D-DIMER, QUANTITATIVE: D-Dimer, Quant: 0.29 ug/mL-FEU (ref 0.00–0.50)

## 2021-04-10 NOTE — ED Notes (Signed)
EKG done and handed to doc

## 2021-04-10 NOTE — ED Provider Notes (Signed)
[facility] EMERGENCY DEPARTMENT Provider Note   CSN: [id number] Arrival date & time: 04/10/21  1734     History  Chief Complaint  Patient presents with   Chest Pain    Christy Velasquez is a 46 y.o. female.  Patient presents to the emergency department for evaluation of right-sided chest pain and shortness of breath.  Symptoms ongoing for 4 days.      Home Medications Prior to Admission medications   Medication Sig Start Date End Date Taking? Authorizing Provider  naproxen (NAPROSYN) 500 MG tablet Take 1 tablet (500 mg total) by mouth 2 (two) times daily as needed for moderate pain. 04/11/21  Yes Genavive Kubicki, Canary Brim, MD  DULoxetine (CYMBALTA) 60 MG capsule Take 60 mg by mouth 2 (two) times daily.    [provider]  metFORMIN (GLUCOPHAGE) 500 MG tablet Take 500 mg by mouth 2 (two) times daily with a meal.    [provider]  ranitidine (ZANTAC) 150 MG tablet Take 150 mg by mouth 2 (two) times daily.    [provider]  sitaGLIPtin-metformin (JANUMET) 50-500 MG per tablet Take 1 tablet by mouth 2 (two) times daily with a meal.    [provider]  topiramate (TOPAMAX) 50 MG tablet Take 50 mg by mouth 2 (two) times daily.     [provider]      Allergies    Penicillins and Shellfish allergy    Review of Systems   Review of Systems  Physical Exam Updated Vital Signs BP 140/82    Pulse 77    Temp 97.8 F (36.6 C)    Resp 16    Wt 93 kg    SpO2 98%    BMI 34.11 kg/m  Physical Exam Vitals and nursing note reviewed.  Constitutional:      General: She is not in acute distress.    Appearance: Normal appearance. She is well-developed.  HENT:     Head: Normocephalic and atraumatic.     Right Ear: Hearing normal.     Left Ear: Hearing normal.     Nose: Nose normal.  Eyes:     Conjunctiva/sclera: Conjunctivae normal.     Pupils: Pupils are equal, round, and reactive to light.  Cardiovascular:     Rate and Rhythm: Regular  rhythm.     Heart sounds: S1 normal and S2 normal. No murmur heard.   No friction rub. No gallop.  Pulmonary:     Effort: Pulmonary effort is normal. No respiratory distress.     Breath sounds: Normal breath sounds.  Chest:     Chest wall: Tenderness present.    Abdominal:     General: Bowel sounds are normal.     Palpations: Abdomen is soft.     Tenderness: There is no abdominal tenderness. There is no guarding or rebound. Negative signs include Murphy's sign and McBurney's sign.     Hernia: No hernia is present.  Musculoskeletal:        General: Normal range of motion.     Cervical back: Normal range of motion and neck supple.  Skin:    General: Skin is warm and dry.     Findings: No rash.  Neurological:     Mental Status: She is alert and oriented to person, place, and time.     GCS: GCS eye subscore is 4. GCS verbal subscore is 5. GCS motor subscore is 6.     Cranial Nerves: No cranial nerve deficit.  Sensory: No sensory deficit.     Coordination: Coordination normal.  Psychiatric:        Speech: Speech normal.        Behavior: Behavior normal.        Thought Content: Thought content normal.    ED Results / Procedures / Treatments   Labs (all labs ordered are listed, but only abnormal results are displayed) Labs Reviewed  BASIC METABOLIC PANEL - Abnormal; Notable for the following components:      Result Value   Glucose, Bld 237 (*)    All other components within normal limits  CBC - Abnormal; Notable for the following components:   WBC 20.0 (*)    Hemoglobin 15.5 (*)    All other components within normal limits  PROTIME-INR  D-DIMER, QUANTITATIVE  POC URINE PREG, ED  TROPONIN I (HIGH SENSITIVITY)  TROPONIN I (HIGH SENSITIVITY)    EKG EKG Interpretation  Date/Time:  Wednesday April 10 2021 22:16:09 EST Ventricular Rate:  70 PR Interval:  113 QRS Duration: 80 QT Interval:  398 QTC Calculation: 430 R Axis:   51 Text Interpretation: Sinus rhythm  Borderline short PR interval Low voltage, precordial leads Borderline T wave abnormalities Confirmed by Gilda Crease [phone]) on 04/10/2021 11:44:04 PM  Radiology DG Chest 2 View  Result Date: 04/10/2021 CLINICAL DATA:  Chest pain. EXAM: CHEST - 2 VIEW COMPARISON:  None. FINDINGS: The heart size and mediastinal contours are within normal limits. Both lungs are clear. The visualized skeletal structures are unremarkable. IMPRESSION: No active cardiopulmonary disease. Electronically Signed   By: Lupita Raider M.D.   On: 04/10/2021 18:10    Procedures Procedures    Medications Ordered in ED Medications - No data to display  ED Course/ Medical Decision Making/ A&P                           Medical Decision Making  Presents to the emergency department for evaluation of right-sided, upper chest pain.  Symptoms present for 4 days.  She reports the pain worsened tonight.  Patient with associated shortness of breath, seems to get out of breath when she walks distances.  Differential diagnosis ACS, nonspecific chest pain, pneumonia, CHF, PE  Chest x-ray clear, no evidence of pulmonary edema or pneumonia.  No pneumothorax.  Independently reviewed and interpreted by me.  Normal sinus rhythm on the monitor.  EKG without ischemic changes.  Troponin negative x2.  D-dimer is negative, with her significant chest wall tenderness, doubt PE.  Presentation seems consistent with musculoskeletal chest pain.  Work-up has been reassuring.  Can discharge with analgesia, follow-up as an outpatient.  Etiology of dyspnea on exertion is unclear.  Will refer to cardiology for further evaluation as an outpatient.         Final Clinical Impression(s) / ED Diagnoses Final diagnoses:  Chest wall pain  DOE (dyspnea on exertion)    Rx / DC Orders ED Discharge Orders          Ordered    Ambulatory referral to Cardiology        04/11/21 0103    naproxen (NAPROSYN) 500 MG tablet  2 times daily PRN         04/11/21 0104              Gilda Crease, MD 04/11/21 0105

## 2021-04-10 NOTE — ED Triage Notes (Signed)
Pt arrives with c/o chest pain that started about 4 days ago. Pt endorses exertional SOB and nausea. Pt hx of sickle cell and lupus.

## 2021-04-11 MED ORDER — NAPROXEN 500 MG PO TABS: 500.0000 mg | ORAL_TABLET | Freq: Two times a day (BID) | ORAL | 0 refills | Status: DC | PRN

## 2021-07-04 ENCOUNTER — Encounter: Payer: Self-pay | Admitting: Cardiology

## 2021-07-04 ENCOUNTER — Encounter (HOSPITAL_COMMUNITY): Payer: Self-pay

## 2021-07-04 ENCOUNTER — Ambulatory Visit (INDEPENDENT_AMBULATORY_CARE_PROVIDER_SITE_OTHER): Payer: Medicaid - Out of State | Admitting: Cardiology

## 2021-07-04 ENCOUNTER — Telehealth: Payer: Self-pay | Admitting: Cardiology

## 2021-07-04 ENCOUNTER — Emergency Department (HOSPITAL_COMMUNITY)
Admission: EM | Admit: 2021-07-04 | Discharge: 2021-07-04 | Disposition: A | Discharge status: Home / Self Care | Payer: Medicaid - Out of State | Attending: Student | Admitting: Student

## 2021-07-04 ENCOUNTER — Other Ambulatory Visit (HOSPITAL_COMMUNITY)
Admission: RE | Admit: 2021-07-04 | Discharge: 2021-07-04 | Disposition: A | Discharge status: Home / Self Care | Payer: Medicaid - Out of State | Source: Ambulatory Visit | Attending: Cardiology | Admitting: Cardiology

## 2021-07-04 ENCOUNTER — Other Ambulatory Visit: Payer: Self-pay

## 2021-07-04 VITALS — BP 126/78 | HR 70 | Ht 65.0 in | Wt 212.0 lb

## 2021-07-04 DIAGNOSIS — Z79899 Other long term (current) drug therapy: Secondary | ICD-10-CM | POA: Diagnosis not present

## 2021-07-04 DIAGNOSIS — R079 Chest pain, unspecified: Secondary | ICD-10-CM | POA: Insufficient documentation

## 2021-07-04 DIAGNOSIS — M329 Systemic lupus erythematosus, unspecified: Secondary | ICD-10-CM

## 2021-07-04 DIAGNOSIS — R0602 Shortness of breath: Secondary | ICD-10-CM | POA: Insufficient documentation

## 2021-07-04 DIAGNOSIS — R42 Dizziness and giddiness: Secondary | ICD-10-CM | POA: Diagnosis not present

## 2021-07-04 DIAGNOSIS — D72829 Elevated white blood cell count, unspecified: Secondary | ICD-10-CM | POA: Diagnosis not present

## 2021-07-04 DIAGNOSIS — F17210 Nicotine dependence, cigarettes, uncomplicated: Secondary | ICD-10-CM | POA: Diagnosis not present

## 2021-07-04 DIAGNOSIS — I10 Essential (primary) hypertension: Secondary | ICD-10-CM | POA: Insufficient documentation

## 2021-07-04 DIAGNOSIS — E1165 Type 2 diabetes mellitus with hyperglycemia: Secondary | ICD-10-CM | POA: Diagnosis present

## 2021-07-04 DIAGNOSIS — R739 Hyperglycemia, unspecified: Secondary | ICD-10-CM

## 2021-07-04 DIAGNOSIS — R110 Nausea: Secondary | ICD-10-CM | POA: Diagnosis not present

## 2021-07-04 DIAGNOSIS — Z7984 Long term (current) use of oral hypoglycemic drugs: Secondary | ICD-10-CM | POA: Diagnosis not present

## 2021-07-04 LAB — BLOOD GAS, VENOUS
Acid-Base Excess: 4.5 mmol/L — ABNORMAL HIGH (ref 0.0–2.0)
Bicarbonate: 29.2 mmol/L — ABNORMAL HIGH (ref 20.0–28.0)
Drawn by: 6352
FIO2: 21 %
O2 Saturation: 92.4 %
Patient temperature: 36.9
pCO2, Ven: 43 mmHg — ABNORMAL LOW (ref 44–60)
pH, Ven: 7.44 — ABNORMAL HIGH (ref 7.25–7.43)
pO2, Ven: 58 mmHg — ABNORMAL HIGH (ref 32–45)

## 2021-07-04 LAB — CBG MONITORING, ED
Glucose-Capillary: 473 mg/dL — ABNORMAL HIGH (ref 70–99)
Glucose-Capillary: 469 mg/dL — ABNORMAL HIGH (ref 70–99)
Glucose-Capillary: 287 mg/dL — ABNORMAL HIGH (ref 70–99)

## 2021-07-04 LAB — BASIC METABOLIC PANEL
Anion gap: 10 (ref 5–15)
Anion gap: 9 (ref 5–15)
BUN: 9 mg/dL (ref 6–20)
BUN: 9 mg/dL (ref 6–20)
CO2: 24 mmol/L (ref 22–32)
CO2: 24 mmol/L (ref 22–32)
Calcium: 9 mg/dL (ref 8.9–10.3)
Calcium: 8.7 mg/dL — ABNORMAL LOW (ref 8.9–10.3)
Chloride: 96 mmol/L — ABNORMAL LOW (ref 98–111)
Chloride: 94 mmol/L — ABNORMAL LOW (ref 98–111)
Creatinine, Ser: 0.73 mg/dL (ref 0.44–1.00)
Creatinine, Ser: 0.55 mg/dL (ref 0.44–1.00)
GFR, Estimated: >60 mL/min (ref >60)
GFR, Estimated: >60 mL/min (ref >60)
Glucose, Bld: 598 mg/dL (ref 70–99)
Glucose, Bld: 490 mg/dL — ABNORMAL HIGH (ref 70–99)
Potassium: 3.9 mmol/L (ref 3.5–5.1)
Potassium: 3.6 mmol/L (ref 3.5–5.1)
Sodium: 130 mmol/L — ABNORMAL LOW (ref 135–145)
Sodium: 127 mmol/L — ABNORMAL LOW (ref 135–145)

## 2021-07-04 LAB — CBC
HCT: 37.1 % (ref 36.0–46.0)
Hemoglobin: 12.6 g/dL (ref 12.0–15.0)
MCH: 30.9 pg (ref 26.0–34.0)
MCHC: 34 g/dL (ref 30.0–36.0)
MCV: 90.9 fL (ref 80.0–100.0)
Platelets: 354 10*3/uL (ref 150–400)
RBC: 4.08 MIL/uL (ref 3.87–5.11)
RDW: 14.4 % (ref 11.5–15.5)
WBC: 16.5 10*3/uL — ABNORMAL HIGH (ref 4.0–10.5)
nRBC: 0 % (ref 0.0–0.2)

## 2021-07-04 LAB — POC URINE PREG, ED: Preg Test, Ur: NEGATIVE

## 2021-07-04 LAB — URINALYSIS, ROUTINE W REFLEX MICROSCOPIC
Bilirubin Urine: NEGATIVE
Glucose, UA: >=500 mg/dL — AB
Ketones, ur: NEGATIVE mg/dL
Nitrite: NEGATIVE
Protein, ur: NEGATIVE mg/dL
Specific Gravity, Urine: 1.033 — ABNORMAL HIGH (ref 1.005–1.030)
pH: 6 (ref 5.0–8.0)

## 2021-07-04 LAB — BETA-HYDROXYBUTYRIC ACID: Beta-Hydroxybutyric Acid: 0.24 mmol/L (ref 0.05–0.27)

## 2021-07-04 MED ORDER — INSULIN ASPART 100 UNIT/ML IJ SOLN
10.0000 [IU] | Freq: Once | INTRAMUSCULAR | Status: AC
  Administered 2021-07-04: 10 [IU] via INTRAVENOUS
  Filled 2021-07-04: qty 1

## 2021-07-04 MED ORDER — LACTATED RINGERS IV BOLUS
1000.0000 mL | Freq: Once | INTRAVENOUS | Status: AC
  Administered 2021-07-04: 1000 mL via INTRAVENOUS

## 2021-07-04 MED ORDER — METOPROLOL TARTRATE 100 MG PO TABS: ORAL_TABLET | ORAL | 0 refills | Status: AC

## 2021-07-04 NOTE — Telephone Encounter (Signed)
Glucose 598 mg/dl, Dr.Turner directs patient to closest ER.Patient notified and will go to closest ER, she lives in [place] ?

## 2021-07-04 NOTE — Telephone Encounter (Signed)
Christy Velasquez is calling to report a critical glucose result ?

## 2021-07-04 NOTE — Patient Instructions (Addendum)
Medication Instructions:  ?Your physician recommends that you continue on your current medications as directed. Please refer to the Current Medication list given to you today. ? ? ?Labwork: ?Bmet today ? ?Testing/Procedures: ?Your physician has requested that you have an echocardiogram. Echocardiography is a painless test that uses sound waves to create images of your heart. It provides your doctor with information about the size and shape of your heart and how well your heart?s chambers and valves are working. This procedure takes approximately one hour. There are no restrictions for this procedure. ? ?Your physician has requested that you have a carotid duplex. This test is an ultrasound of the carotid arteries in your neck. It looks at blood flow through these arteries that supply the brain with blood. Allow one hour for this exam. There are no restrictions or special instructions. ? ?Your physician has requested that you have cardiac CT. Cardiac computed tomography (CT) is a painless test that uses an x-ray machine to take clear, detailed pictures of your heart. For further information please visit [email or web address]. Please follow instruction sheet as given. ?  ? ? ?Follow-Up: ?As needed ? ? ?Any Other Special Instructions Will Be Listed Below (If Applicable). ? ?If you need a refill on your cardiac medications before your next appointment, please call your pharmacy. ? ? ? ? ? ?Your cardiac CT will be scheduled at one of the below locations:  ? ?[facility] ?[address] ?[place], [place] [address] ?[phone] ? ?If scheduled at [facility], please arrive at the [facility] and Children's Entrance (Entrance C2) of [facility] 30 minutes prior to test start time. ?You can use the FREE valet parking offered at entrance C (encouraged to control the heart rate for the test)  ?Proceed to the [facility] Radiology Department (first floor) to check-in and test prep. ? ?All radiology  patients and guests should use entrance C2 at [facility], accessed from [facility], even though the hospital's physical address listed is [address] ? ? ? ?If scheduled at [facility], please arrive 15 mins early for check-in and test prep. ? ?Please follow these instructions carefully (unless otherwise directed): ? ? ? ?On the Night Before the Test: ?Be sure to Drink plenty of water. ?Do not consume any caffeinated/decaffeinated beverages or chocolate 12 hours prior to your test. ?Do not take any antihistamines 12 hours prior to your test. ? ?On the Day of the Test: ?Drink plenty of water until 1 hour prior to the test. ?Do not eat any food 4 hours prior to the test. ?You may take your regular medications prior to the test.  ?Take metoprolol (Lopressor) two hours prior to test. ?HOLD Furosemide/Hydrochlorothiazide morning of the test. ?FEMALES- please wear underwire-free bra if available, avoid dresses & tight clothing ? ?     ?After the Test: ?Drink plenty of water. ?After receiving IV contrast, you may experience a mild flushed feeling. This is normal. ?On occasion, you may experience a mild rash up to 24 hours after the test. This is not dangerous. If this occurs, you can take Benadryl 25 mg and increase your fluid intake. ?If you experience trouble breathing, this can be serious. If it is severe call 911 IMMEDIATELY. If it is mild, please call our office. ?If you take any of these medications: Glipizide/Metformin, Avandament, Glucavance, please do not take 48 hours after completing test unless otherwise instructed. ? ?We will call to schedule your test  2-4 weeks out understanding that some insurance companies will need an authorization prior to the service being performed.  ? ?For non-scheduling related questions, please contact the cardiac imaging nurse navigator should you have any questions/concerns: ?Marchia Bond, Cardiac Imaging Nurse  Navigator ?Gordy Clement, Cardiac Imaging Nurse Navigator ?[facility] ?Direct Office Dial: [phone]  ? ?For scheduling needs, including cancellations and rescheduling, please call [place], [phone]. ? ? ?

## 2021-07-04 NOTE — Addendum Note (Signed)
Addended by: Marlyn Corporal A on: 07/04/2021 10:10 AM ? ? Modules accepted: Orders ? ?

## 2021-07-04 NOTE — ED Triage Notes (Signed)
Pt reports had high blood glucose at [facility] this morning.  Reports bgl 598.  Reports increased thirst and increased urinary frequency.  Pt is on metformin for diabetes.  Resp even and unlabored.  Skin warm and dry.  nad ?

## 2021-07-04 NOTE — Progress Notes (Signed)
? ?Cardiology CONSULT Note   ? ?Date:  07/04/2021  ? ?IDGlenyce Velasquez, DOB 1976-01-29, MRN [id number] ? ?PCP:  Georgiann Hahn, MD  ?Cardiologist:  Armanda Magic, MD  ? ?Chief Complaint  ?Patient presents with  ? New Patient (Initial Visit)  ?  Chest pain  ? ? ?History of Present Illness:  ?Christy Velasquez is a 46 y.o. female who is being seen today for the evaluation of chest pain at the request of Gilda Crease,*. ? ?This is a 46 year old African-American female with a history of diabetes mellitus type 2, hypertension, lupus and sickle cell anemia who presents for evaluation of chest pain.  She tells me that her CP has been going on for about 4 weeks.  She gets pain that starts in her right arm and runs up her arm with tingling and heaviness.  She thinks it is from the lupus as it gets worse when she has a lupus flare.  The pain will radiate into her right chest and then gets very SOB.  She will get nauseated with diaphoresis when she gets the pain.  It can last constant for up to 1 month at a time.  The pain is worse with movements but also with exertion.  She has noticed DOE with walking lifting clothes basket.  She occasionally has some mild LE edema.  She denies any palpitations but has had some problems with dizziness describes it as the walls closing in.  She has had syncope in the past that occurs with lupus flares.  The last time was when she lifted a clothes basket.  She has passed out when she looks up to change light bulbs or when stooping down and getting back up the room starts spinning. She does smoke 1 pack of cig weekly.  Her maternal GM had an MI at 59yo and her mom has CHF.   ? ?Past Medical History:  ?Diagnosis Date  ? Chronic back pain   ? Chronic foot pain   ? Chronic mid back pain   ? Diabetes mellitus without complication (HCC)   ? Hypertension   ? Lupus (HCC)   ? Migraines   ? Sciatica   ? Sickle cell anemia (HCC)   ? ? ?Past Surgical History:  ?Procedure Laterality Date  ?  APPENDECTOMY    ? DILATION AND CURETTAGE, DIAGNOSTIC / THERAPEUTIC    ? ? ?Current Medications: ?Current Meds  ?Medication Sig  ? azaTHIOprine (IMURAN) 50 MG tablet Take 100 mg by mouth daily.  ? BD SYRINGE SLIP TIP 26G X 3/8" 1 ML MISC   ? DULoxetine (CYMBALTA) 60 MG capsule Take 60 mg by mouth 2 (two) times daily.  ? folic acid (FOLVITE) 1 MG tablet Take 1 mg by mouth daily.  ? gabapentin (NEURONTIN) 800 MG tablet Take 800 mg by mouth 4 (four) times daily.  ? HYDROcodone-acetaminophen (NORCO) 7.5-325 MG tablet   ? hydroxychloroquine (PLAQUENIL) 200 MG tablet Take 200 mg by mouth 2 (two) times daily.  ? metFORMIN (GLUCOPHAGE) 500 MG tablet Take 500 mg by mouth 2 (two) times daily with a meal.  ? methotrexate 25 MG/ML injection Inject into the skin once a week.  ? naproxen (NAPROSYN) 500 MG tablet Take 1 tablet (500 mg total) by mouth 2 (two) times daily as needed for moderate pain.  ? omeprazole (PRILOSEC) 20 MG capsule Take 20 mg by mouth daily.  ? phentermine (ADIPEX-P) 37.5 MG tablet Take 37.5 mg by mouth daily.  ? predniSONE (  DELTASONE) 20 MG tablet Take 60 mg by mouth daily.  ? pregabalin (LYRICA) 150 MG capsule Take 150 mg by mouth daily.  ? sitaGLIPtin-metformin (JANUMET) 50-500 MG per tablet Take 1 tablet by mouth 2 (two) times daily with a meal.  ? SUMAtriptan (IMITREX) 100 MG tablet Take by mouth daily.  ? topiramate (TOPAMAX) 50 MG tablet Take 50 mg by mouth 2 (two) times daily.   ? traZODone (DESYREL) 50 MG tablet Take 50 mg by mouth at bedtime.  ? WEEKLY-D 1.25 MG (50000 UT) capsule Take 50,000 Units by mouth once a week.  ? ? ?Allergies:   Penicillins and Shellfish allergy  ? ?Social History  ? ?Socioeconomic History  ? Marital status: Single  ?  Spouse name: Not on file  ? Number of children: Not on file  ? Years of education: Not on file  ? Highest education level: Not on file  ?Occupational History  ? Not on file  ?Tobacco Use  ? Smoking status: Some Days  ?  Packs/day: 0.10  ?  Types: Cigarettes   ? Smokeless tobacco: Not on file  ?Vaping Use  ? Vaping Use: Never used  ?Substance and Sexual Activity  ? Alcohol use: No  ? Drug use: No  ? Sexual activity: Yes  ?  Birth control/protection: Implant  ?Other Topics Concern  ? Not on file  ?Social History Narrative  ? Not on file  ? ?Social Determinants of Health  ? ?Financial Resource Strain: Not on file  ?Food Insecurity: Not on file  ?Transportation Needs: Not on file  ?Physical Activity: Not on file  ?Stress: Not on file  ?Social Connections: Not on file  ?  ? ?Family History:  The patient's family history includes Diabetes in her brother, mother, and another family member; Heart failure in her mother and another family member; Hypertension in her mother.  ? ?ROS:   ?Please see the history of present illness.    ?ROS All other systems reviewed and are negative. ? ?   ? View : No data to display.  ?  ?  ?  ? ? ? ? ? ?PHYSICAL EXAM:   ?VS:  BP 126/78   Pulse 70   Ht 5\' 5"  (1.651 m)   Wt 212 lb (96.2 kg)   SpO2 93%   BMI 35.28 kg/m?    ?GEN: Well nourished, well developed, in no acute distress  ?HEENT: normal  ?Neck: no JVD, carotid bruits, or masses ?Cardiac: RRR; no murmurs, rubs, or gallops,no edema.  Intact distal pulses bilaterally.  ?Respiratory:  clear to auscultation bilaterally, normal work of breathing ?GI: soft, nontender, nondistended, + BS ?MS: no deformity or atrophy  ?Skin: warm and dry, no rash ?Neuro:  Alert and Oriented x 3, Strength and sensation are intact ?Psych: euthymic mood, full affect ? ?Wt Readings from Last 3 Encounters:  ?07/04/21 212 lb (96.2 kg)  ?04/10/21 205 lb (93 kg)  ?11/27/14 238 lb (108 kg)  ?  ? ? ?Studies/Labs Reviewed:  ? ?EKG:  EKG is not ordered today.  The ekg done 04/2021 demonstrates NSR with nonspecific T wave abnormality  ? ?Recent Labs: ?04/10/2021: BUN 18; Creatinine, Ser 0.74; Hemoglobin 15.5; Platelets 395; Potassium 3.9; Sodium 137  ? ?Lipid Panel ?No results found for: CHOL, TRIG, HDL, CHOLHDL, VLDL, LDLCALC,  LDLDIRECT ? ? ?Additional studies/ records that were reviewed today include:  ?none ? ? ? ?ASSESSMENT:   ? ?1. Chest pain of uncertain etiology   ?2. Benign  essential HTN   ?3. SLE (systemic lupus erythematosus related syndrome) (HCC)   ?4. Dizziness   ? ? ? ?PLAN:  ?In order of problems listed above: ? ?Chest pain/DOE ?-her symptoms are very atypical for CAD but she does have cardiac risk factors including hypertension, diabetes, history of tobacco use and history of lupus which can increase risk of vascular disease ?-she also is at risk for pericardial effusion given her Lupus ?-I recommended getting a coronary CTA to define coronary anatomy and rule out CAD ?-check 2D echo to rule out pericardial effusion ? ?2.  Hypertension ?-BP is controlled on exam today ?-She has not required any antihypertensive medication ? ?3.  Lupus ?-Currently on azathioprine, hydroxychloroquine and methotrexate as well as prednisone ? ?4.  Dizziness/Syncope ?-she is vague in her description.  Some of it sounds like vertigo but at other times sounds like orthostatic hypotension.  ?-check 2D echo and carotid dopplers to assess vertebral and carotid arteries ? ?Time Spent: ?20 minutes total time of encounter, including 15 minutes spent in face-to-face patient care on the date of this encounter. This time includes coordination of care and counseling regarding above mentioned problem list. Remainder of non-face-to-face time involved reviewing chart documents/testing relevant to the patient encounter and documentation in the medical record. I have independently reviewed documentation from referring provider ? ?Medication Adjustments/Labs and Tests Ordered: ?Current medicines are reviewed at length with the patient today.  Concerns regarding medicines are outlined above.  Medication changes, Labs and Tests ordered today are listed in the Patient Instructions below. ? ?There are no Patient Instructions on file for this  visit. ? ? ?Signed, ?Armanda Magicraci Naomie Crow, MD  ?07/04/2021 9:58 AM    ?[facility] ?[address], [place], [place]  [address] ?Phone: [phone]) [phone]; Fax: [phone]) [phone]  ? ?

## 2021-07-04 NOTE — ED Provider Notes (Signed)
?[facility] DEPARTMENT ?Provider Note ? ?CSN: [id number] ?Arrival date & time: 07/04/21 1313 ? ?Chief Complaint(s) ?Hyperglycemia ? ?HPI ?Christy Velasquez is a 46 y.o. female with PMH T2DM, lupus, sickle cell who presents emergency department for evaluation of hyperglycemia.  Patient was seen in outpatient cardiology clinic for atypical chest pain when labs were obtained and patient had a sugar greater than 500.  She was instructed to come to the emergency department for evaluation.  Patient is currently not on insulin and states that she was initially taking metformin 4 times daily and has since been switched to 2 times daily.  She states that she has had increased polyuria, polydipsia but denies vomiting, chest pain, shortness of breath, abdominal pain or other systemic symptoms.  She does endorse mild nausea. ? ? ?Hyperglycemia ?Associated symptoms: increased thirst, nausea and polyuria   ? ?Past Medical History ?Past Medical History:  ?Diagnosis Date  ? Chronic back pain   ? Chronic foot pain   ? Chronic mid back pain   ? Diabetes mellitus without complication (HCC)   ? Hypertension   ? Lupus (HCC)   ? Migraines   ? Sciatica   ? Sickle cell anemia (HCC)   ? ?Patient Active Problem List  ? Diagnosis Date Noted  ? Fibromyalgia 08/28/2020  ? History of type 2 diabetes mellitus 08/28/2020  ? Recurrent major depressive disorder (HCC) 08/28/2020  ? GERD (gastroesophageal reflux disease) 02/28/2014  ? Preventative health care 07/07/2013  ? Migraine headache 10/05/2012  ? Hyperlipidemia 10/08/2011  ? DM (diabetes mellitus), type 2 (HCC) 09/10/2011  ? Headache 09/10/2011  ? Lower back pain 08/26/2011  ? Anxiety and depression 08/21/2011  ? ?Home Medication(s) ?Prior to Admission medications   ?Medication Sig Start Date End Date Taking? Authorizing Provider  ?azaTHIOprine (IMURAN) 50 MG tablet Take 100 mg by mouth daily. 06/04/21   [provider]  ?BD SYRINGE SLIP TIP 26G X 3/8" 1 ML MISC  06/04/21    [provider]  ?DULoxetine (CYMBALTA) 60 MG capsule Take 60 mg by mouth 2 (two) times daily.    [provider]  ?folic acid (FOLVITE) 1 MG tablet Take 1 mg by mouth daily. 06/04/21   [provider]  ?gabapentin (NEURONTIN) 800 MG tablet Take 800 mg by mouth 4 (four) times daily. 06/04/21   [provider]  ?HYDROcodone-acetaminophen (NORCO) 7.5-325 MG tablet  02/01/14   [provider]  ?hydroxychloroquine (PLAQUENIL) 200 MG tablet Take 200 mg by mouth 2 (two) times daily. 06/04/21   [provider]  ?metFORMIN (GLUCOPHAGE) 500 MG tablet Take 500 mg by mouth 2 (two) times daily with a meal.    [provider]  ?methotrexate 25 MG/ML injection Inject into the skin once a week. 06/04/21   [provider]  ?metoprolol tartrate (LOPRESSOR) 100 MG tablet Take 100 mg 2 hours before Cardiac CT 07/04/21   Quintella Reichert, MD  ?naproxen (NAPROSYN) 500 MG tablet Take 1 tablet (500 mg total) by mouth 2 (two) times daily as needed for moderate pain. 04/11/21   Gilda Crease, MD  ?omeprazole (PRILOSEC) 20 MG capsule Take 20 mg by mouth daily. 06/05/21   [provider]  ?phentermine (ADIPEX-P) 37.5 MG tablet Take 37.5 mg by mouth daily. 06/04/21   [provider]  ?predniSONE (DELTASONE) 20 MG tablet Take 60 mg by mouth daily. 05/21/21   [provider]  ?pregabalin (LYRICA) 150 MG capsule Take 150 mg by mouth daily. 12/27/19  [provider]  ?sitaGLIPtin-metformin (JANUMET) 50-500 MG per tablet Take 1 tablet by mouth 2 (two) times daily with a meal.    [provider]  ?SUMAtriptan (IMITREX) 100 MG tablet Take by mouth daily. 06/26/21   [provider]  ?topiramate (TOPAMAX) 50 MG tablet Take 50 mg by mouth 2 (two) times daily.     [provider]  ?traZODone (DESYREL) 50 MG tablet Take 50 mg by mouth at bedtime. 05/30/21   [provider]  ?WEEKLY-D 1.25 MG (50000 UT) capsule Take  50,000 Units by mouth once a week. 06/04/21   [provider]  ?                                                                                                                                  ?Past Surgical History ?Past Surgical History:  ?Procedure Laterality Date  ? APPENDECTOMY    ? DILATION AND CURETTAGE, DIAGNOSTIC / THERAPEUTIC    ? ?Family History ?Family History  ?Problem Relation Age of Onset  ? Diabetes Mother   ? Heart failure Mother   ? Hypertension Mother   ? Diabetes Brother   ? Diabetes Other   ? Heart failure Other   ? ? ?Social History ?Social History  ? ?Tobacco Use  ? Smoking status: Some Days  ?  Packs/day: 0.10  ?  Types: Cigarettes  ?Vaping Use  ? Vaping Use: Never used  ?Substance Use Topics  ? Alcohol use: No  ? Drug use: No  ? ?Allergies ?Penicillins and Shellfish allergy ? ?Review of Systems ?Review of Systems  ?Gastrointestinal:  Positive for nausea.  ?Endocrine: Positive for polydipsia and polyuria.  ? ?Physical Exam ?Vital Signs  ?I have reviewed the triage vital signs ?BP 124/74   Pulse 71   Temp 98.5 ?F (36.9 ?C) (Oral)   Resp 19   Ht 5\' 5"  (1.651 m)   Wt 99.3 kg   SpO2 95%   BMI 36.44 kg/m?  ? ?Physical Exam ?Vitals and nursing note reviewed.  ?Constitutional:   ?   General: She is not in acute distress. ?   Appearance: She is well-developed.  ?HENT:  ?   Head: Normocephalic and atraumatic.  ?Eyes:  ?   Conjunctiva/sclera: Conjunctivae normal.  ?Cardiovascular:  ?   Rate and Rhythm: Normal rate and regular rhythm.  ?   Heart sounds: No murmur heard. ?Pulmonary:  ?   Effort: Pulmonary effort is normal. No respiratory distress.  ?   Breath sounds: Normal breath sounds.  ?Abdominal:  ?   Palpations: Abdomen is soft.  ?   Tenderness: There is no abdominal tenderness.  ?Musculoskeletal:     ?   General: No swelling.  ?   Cervical back: Neck supple.  ?Skin: ?   General: Skin is warm and dry.  ?   Capillary Refill: Capillary refill takes less than 2 seconds.  ?Neurological:   ?  Mental Status: She is alert.  ?Psychiatric:     ?   Mood and Affect: Mood normal.  ? ? ?ED Results and Treatments ?Labs ?(all labs ordered are listed, but only abnormal results are displayed) ?Labs Reviewed  ?BASIC METABOLIC PANEL - Abnormal; Notable for the following components:  ?    Result Value  ? Sodium 127 (*)   ? Chloride 94 (*)   ? Glucose, Bld 490 (*)   ? Calcium 8.7 (*)   ? All other components within normal limits  ?CBC - Abnormal; Notable for the following components:  ? WBC 16.5 (*)   ? All other components within normal limits  ?URINALYSIS, ROUTINE W REFLEX MICROSCOPIC - Abnormal; Notable for the following components:  ? Color, Urine STRAW (*)   ? Specific Gravity, Urine 1.033 (*)   ? Glucose, UA >=500 (*)   ? Hgb urine dipstick SMALL (*)   ? Leukocytes,Ua TRACE (*)   ? Bacteria, UA RARE (*)   ? All other components within normal limits  ?CBG MONITORING, ED - Abnormal; Notable for the following components:  ? Glucose-Capillary 473 (*)   ? All other components within normal limits  ?CBG MONITORING, ED - Abnormal; Notable for the following components:  ? Glucose-Capillary 469 (*)   ? All other components within normal limits  ?BLOOD GAS, VENOUS  ?BETA-HYDROXYBUTYRIC ACID  ?POC URINE PREG, ED  ?                                                                                                                       ? ?Radiology ?No results found. ? ?Pertinent labs & imaging results that were available during my care of the patient were reviewed by me and considered in my medical decision making (see MDM for details). ? ?Medications Ordered in ED ?Medications  ?lactated ringers bolus 1,000 mL (has no administration in time range)  ?                                                               ?                                                                    ?Procedures ?Mar[facility] KitchenCritical Care ?Performed by: Glendora Score, MD ?Authorized by: Glendora Score, MD  ? ?Critical care provider statement:  ?   Critical care time (minutes):  30 ?  Critical care was necessary to treat or prevent imminent or life-threatening deterioration of the following conditions:  Endocrine crisis ?  Critical care was  time spent personally b

## 2021-07-06 ENCOUNTER — Emergency Department (HOSPITAL_COMMUNITY)
Admission: EM | Admit: 2021-07-06 | Discharge: 2021-07-06 | Disposition: A | Discharge status: Home / Self Care | Payer: Medicaid - Out of State | Attending: Emergency Medicine | Admitting: Emergency Medicine

## 2021-07-06 ENCOUNTER — Emergency Department (HOSPITAL_COMMUNITY): Payer: Medicaid - Out of State

## 2021-07-06 ENCOUNTER — Encounter (HOSPITAL_COMMUNITY): Payer: Self-pay | Admitting: Emergency Medicine

## 2021-07-06 ENCOUNTER — Other Ambulatory Visit: Payer: Self-pay

## 2021-07-06 DIAGNOSIS — Z79899 Other long term (current) drug therapy: Secondary | ICD-10-CM | POA: Diagnosis not present

## 2021-07-06 DIAGNOSIS — R739 Hyperglycemia, unspecified: Secondary | ICD-10-CM | POA: Diagnosis present

## 2021-07-06 DIAGNOSIS — E871 Hypo-osmolality and hyponatremia: Secondary | ICD-10-CM | POA: Diagnosis not present

## 2021-07-06 DIAGNOSIS — R059 Cough, unspecified: Secondary | ICD-10-CM | POA: Insufficient documentation

## 2021-07-06 DIAGNOSIS — N3000 Acute cystitis without hematuria: Secondary | ICD-10-CM | POA: Insufficient documentation

## 2021-07-06 DIAGNOSIS — Z7984 Long term (current) use of oral hypoglycemic drugs: Secondary | ICD-10-CM | POA: Diagnosis not present

## 2021-07-06 DIAGNOSIS — Z20822 Contact with and (suspected) exposure to covid-19: Secondary | ICD-10-CM | POA: Diagnosis not present

## 2021-07-06 DIAGNOSIS — R110 Nausea: Secondary | ICD-10-CM | POA: Diagnosis not present

## 2021-07-06 DIAGNOSIS — E1165 Type 2 diabetes mellitus with hyperglycemia: Secondary | ICD-10-CM | POA: Diagnosis not present

## 2021-07-06 DIAGNOSIS — D72829 Elevated white blood cell count, unspecified: Secondary | ICD-10-CM | POA: Insufficient documentation

## 2021-07-06 LAB — URINALYSIS, ROUTINE W REFLEX MICROSCOPIC
Bilirubin Urine: NEGATIVE
Glucose, UA: >=500 mg/dL — AB
Ketones, ur: 5 mg/dL — AB
Nitrite: NEGATIVE
Protein, ur: NEGATIVE mg/dL
Specific Gravity, Urine: 1.029 (ref 1.005–1.030)
pH: 7 (ref 5.0–8.0)

## 2021-07-06 LAB — COMPREHENSIVE METABOLIC PANEL
ALT: 22 U/L (ref 0–44)
AST: 18 U/L (ref 15–41)
Albumin: 3.6 g/dL (ref 3.5–5.0)
Alkaline Phosphatase: 76 U/L (ref 38–126)
Anion gap: 8 (ref 5–15)
BUN: 14 mg/dL (ref 6–20)
CO2: 25 mmol/L (ref 22–32)
Calcium: 9.7 mg/dL (ref 8.9–10.3)
Chloride: 97 mmol/L — ABNORMAL LOW (ref 98–111)
Creatinine, Ser: 0.84 mg/dL (ref 0.44–1.00)
GFR, Estimated: >60 mL/min (ref >60)
Glucose, Bld: 369 mg/dL — ABNORMAL HIGH (ref 70–99)
Potassium: 4.4 mmol/L (ref 3.5–5.1)
Sodium: 130 mmol/L — ABNORMAL LOW (ref 135–145)
Total Bilirubin: 0.4 mg/dL (ref 0.3–1.2)
Total Protein: 7.3 g/dL (ref 6.5–8.1)

## 2021-07-06 LAB — I-STAT VENOUS BLOOD GAS, ED
Acid-Base Excess: 2 mmol/L (ref 0.0–2.0)
Bicarbonate: 28.7 mmol/L — ABNORMAL HIGH (ref 20.0–28.0)
Calcium, Ion: 1.22 mmol/L (ref 1.15–1.40)
HCT: 44 % (ref 36.0–46.0)
Hemoglobin: 15 g/dL (ref 12.0–15.0)
O2 Saturation: 99 %
Potassium: 4.5 mmol/L (ref 3.5–5.1)
Sodium: 130 mmol/L — ABNORMAL LOW (ref 135–145)
TCO2: 30 mmol/L (ref 22–32)
pCO2, Ven: 49.8 mmHg (ref 44–60)
pH, Ven: 7.368 (ref 7.25–7.43)
pO2, Ven: 138 mmHg — ABNORMAL HIGH (ref 32–45)

## 2021-07-06 LAB — RESP PANEL BY RT-PCR (FLU A&B, COVID) ARPGX2
Influenza A by PCR: NEGATIVE
Influenza B by PCR: NEGATIVE
SARS Coronavirus 2 by RT PCR: NEGATIVE

## 2021-07-06 LAB — CBG MONITORING, ED
Glucose-Capillary: 355 mg/dL — ABNORMAL HIGH (ref 70–99)
Glucose-Capillary: 232 mg/dL — ABNORMAL HIGH (ref 70–99)
Glucose-Capillary: 158 mg/dL — ABNORMAL HIGH (ref 70–99)

## 2021-07-06 LAB — CBC
HCT: 41.4 % (ref 36.0–46.0)
Hemoglobin: 14 g/dL (ref 12.0–15.0)
MCH: 30.8 pg (ref 26.0–34.0)
MCHC: 33.8 g/dL (ref 30.0–36.0)
MCV: 91.2 fL (ref 80.0–100.0)
Platelets: 407 10*3/uL — ABNORMAL HIGH (ref 150–400)
RBC: 4.54 MIL/uL (ref 3.87–5.11)
RDW: 14.7 % (ref 11.5–15.5)
WBC: 20.7 10*3/uL — ABNORMAL HIGH (ref 4.0–10.5)
nRBC: 0 % (ref 0.0–0.2)

## 2021-07-06 LAB — LIPASE, BLOOD: Lipase: 44 U/L (ref 11–51)

## 2021-07-06 LAB — I-STAT BETA HCG BLOOD, ED (MC, WL, AP ONLY): I-stat hCG, quantitative: <5 m[IU]/mL (ref <5)

## 2021-07-06 LAB — CK: Total CK: 43 U/L (ref 38–234)

## 2021-07-06 MED ORDER — MORPHINE SULFATE (PF) 4 MG/ML IV SOLN
4.0000 mg | Freq: Once | INTRAVENOUS | Status: AC
  Administered 2021-07-06: 4 mg via INTRAVENOUS
  Filled 2021-07-06: qty 1

## 2021-07-06 MED ORDER — NITROFURANTOIN MONOHYD MACRO 100 MG PO CAPS: 100.0000 mg | ORAL_CAPSULE | Freq: Two times a day (BID) | ORAL | 0 refills | Status: AC

## 2021-07-06 MED ORDER — NITROFURANTOIN MONOHYD MACRO 100 MG PO CAPS
100.0000 mg | ORAL_CAPSULE | Freq: Once | ORAL | Status: AC
  Administered 2021-07-06: 100 mg via ORAL
  Filled 2021-07-06 (×2): qty 1

## 2021-07-06 MED ORDER — ONDANSETRON 4 MG PO TBDP: 4.0000 mg | ORAL_TABLET | Freq: Three times a day (TID) | ORAL | 0 refills | Status: DC | PRN

## 2021-07-06 MED ORDER — SODIUM CHLORIDE 0.9 % IV BOLUS
1000.0000 mL | Freq: Once | INTRAVENOUS | Status: AC
  Administered 2021-07-06: 1000 mL via INTRAVENOUS

## 2021-07-06 MED ORDER — IOHEXOL 300 MG/ML  SOLN
100.0000 mL | Freq: Once | INTRAMUSCULAR | Status: AC | PRN
  Administered 2021-07-06: 100 mL via INTRAVENOUS

## 2021-07-06 MED ORDER — LACTATED RINGERS IV BOLUS
1000.0000 mL | Freq: Once | INTRAVENOUS | Status: DC
Start: 2021-07-06 — End: 2021-07-06

## 2021-07-06 MED ORDER — HYDROMORPHONE HCL 1 MG/ML IJ SOLN
0.5000 mg | Freq: Once | INTRAMUSCULAR | Status: AC
  Administered 2021-07-06: 0.5 mg via INTRAVENOUS
  Filled 2021-07-06: qty 1

## 2021-07-06 MED ORDER — INSULIN ASPART 100 UNIT/ML IJ SOLN
8.0000 [IU] | Freq: Once | INTRAMUSCULAR | Status: AC
  Administered 2021-07-06: 8 [IU] via SUBCUTANEOUS

## 2021-07-06 NOTE — Discharge Instructions (Signed)
Keep your follow-up appointment with your doctor on Monday ? ?Take the medications as prescribed ?

## 2021-07-06 NOTE — ED Triage Notes (Signed)
Pt states her blood sugar was 601 this morning.  States she had been without insulin for 1 1/2 years and was seen at [facility] 2 days ago and given Rx for insulin.  States pharmacy didn't have it in stock.  Reports abd pain, nausea, vomiting, and frequent urination. ?

## 2021-07-06 NOTE — ED Notes (Signed)
Patient transported to CT 

## 2021-07-06 NOTE — ED Provider Notes (Signed)
?[place] ?Provider Note ? ? ?CSN: [id number] ?Arrival date & time: 07/06/21  1420 ? ?  ?History ? ?Chief Complaint  ?Patient presents with  ? Hyperglycemia  ? ? ?Christy Velasquez is a 46 y.o. female history of chronic pain, lupus, diabetes, chronic headache here for evaluation of feeling unwell.  Seen 2 days ago at [facility] for hyperglycemia.  Patient states she has been taking her metformin.  Currently has appointment early this week to get started on insulin due to persistent hyperglycemia.  Patient states she has diffuse abdominal pain, nausea, dry heaving, polyuria, polydipsia.  Patient states she hurts "everywhere."  Has a mild nonproductive cough.  No recent sick contacts.  No fever, chest pain, shortness of breath.  Some dysuria without hematuria.  States last night her blood sugar was "600."  Denies additional aggravating or relieving factors. On chronic steroids (prednisone) for her lupus. Denies rash. Denies chance of pregnancy. States she is not taking her Metformin as it " doesn't work." ? ?Ate pasta and sauce last night for dinner. ? ?HPI ? ?  ? ?Home Medications ?Prior to Admission medications   ?Medication Sig Start Date End Date Taking? Authorizing Provider  ?nitrofurantoin, macrocrystal-monohydrate, (MACROBID) 100 MG capsule Take 1 capsule (100 mg total) by mouth 2 (two) times daily. 07/06/21  Yes Notnamed Croucher A, PA-C  ?ondansetron (ZOFRAN-ODT) 4 MG disintegrating tablet Take 1 tablet (4 mg total) by mouth every 8 (eight) hours as needed for nausea or vomiting. 07/06/21  Yes Teigan Sahli A, PA-C  ?azaTHIOprine (IMURAN) 50 MG tablet Take 100 mg by mouth daily. 06/04/21   [provider]  ?BD SYRINGE SLIP TIP 26G X 3/8" 1 ML [place]  06/04/21   [provider]  ?DULoxetine (CYMBALTA) 60 MG capsule Take 60 mg by mouth 2 (two) times daily.    [provider]  ?folic acid (FOLVITE) 1 MG tablet Take 1 mg by mouth daily. 06/04/21   [provider]  ?gabapentin (NEURONTIN) 800 MG tablet Take 800 mg by mouth 4 (four) times daily. 06/04/21   [provider]  ?HYDROcodone-acetaminophen ([place]) 7.5-325 MG tablet  02/01/14   [provider]  ?hydroxychloroquine (PLAQUENIL) 200 MG tablet Take 200 mg by mouth 2 (two) times daily. 06/04/21   [provider]  ?metFORMIN (GLUCOPHAGE) 500 MG tablet Take 500 mg by mouth 2 (two) times daily with a meal.    [provider]  ?methotrexate 25 MG/ML injection Inject into the skin once a week. 06/04/21   [provider]  ?metoprolol tartrate (LOPRESSOR) 100 MG tablet Take 100 mg 2 hours before Cardiac CT 07/04/21   Sueanne Margarita, MD  ?naproxen (NAPROSYN) 500 MG tablet Take 1 tablet (500 mg total) by mouth 2 (two) times daily as needed for moderate pain. 04/11/21   Orpah Greek, MD  ?omeprazole (PRILOSEC) 20 MG capsule Take 20 mg by mouth daily. 06/05/21   [provider]  ?phentermine (ADIPEX-P) 37.5 MG tablet Take 37.5 mg by mouth daily. 06/04/21   [provider]  ?predniSONE (DELTASONE) 20 MG tablet Take 60 mg by mouth daily. 05/21/21   [provider]  ?pregabalin (LYRICA) 150 MG capsule Take 150 mg by mouth daily. 12/27/19   [provider]  ?sitaGLIPtin-metformin (JANUMET) 50-500 MG per tablet Take 1 tablet by mouth 2 (two) times daily with a meal.    [provider]  ?SUMAtriptan (IMITREX) 100 MG tablet Take by mouth daily. 06/26/21  [provider]  ?topiramate (TOPAMAX) 50 MG tablet Take 50 mg by mouth 2 (two) times daily.     [provider]  ?traZODone (DESYREL) 50 MG tablet Take 50 mg by mouth at bedtime. 05/30/21   [provider]  ?WEEKLY-D 1.25 MG (50000 UT) capsule Take 50,000 Units by mouth once a week. 06/04/21   [provider]  ?   ? ?Allergies    ?Penicillins and Shellfish allergy   ? ?Review of Systems   ?Review of Systems  ?Constitutional:  Positive for activity  change, appetite change and fatigue.  ?HENT: Negative.    ?Respiratory:  Positive for cough. Negative for shortness of breath, wheezing and stridor.   ?Cardiovascular: Negative.   ?Gastrointestinal:  Positive for abdominal pain and nausea. Negative for abdominal distention (diffuse), anal bleeding, blood in stool, constipation, diarrhea, rectal pain and vomiting.  ?Endocrine: Positive for polydipsia and polyuria.  ?Genitourinary:  Positive for dysuria and urgency. Negative for decreased urine volume, difficulty urinating, frequency, hematuria, menstrual problem, pelvic pain, vaginal bleeding, vaginal discharge and vaginal pain.  ?Musculoskeletal:  Positive for myalgias.  ?Skin: Negative.   ?Neurological:  Positive for weakness (generalized). Negative for dizziness, tremors, seizures, syncope, facial asymmetry, speech difficulty, light-headedness, numbness and headaches.  ?All other systems reviewed and are negative. ? ?Physical Exam ?Updated Vital Signs ?BP (!) 102/53   Pulse (!) 58   Temp 98.5 ?F (36.9 ?C) (Oral)   Resp 19   SpO2 94%  ?Physical Exam ?Vitals and nursing note reviewed.  ?Constitutional:   ?   General: She is not in acute distress. ?   Appearance: She is well-developed. She is not ill-appearing, toxic-appearing or diaphoretic.  ?HENT:  ?   Head: Normocephalic and atraumatic.  ?   Nose: Nose normal.  ?   Mouth/Throat:  ?   Mouth: Mucous membranes are moist.  ?Eyes:  ?   Pupils: Pupils are equal, round, and reactive to light.  ?Cardiovascular:  ?   Rate and Rhythm: Normal rate.  ?   Pulses: Normal pulses.     ?     Radial pulses are 2+ on the right side and 2+ on the left side.  ?     Dorsalis pedis pulses are 2+ on the right side and 2+ on the left side.  ?   Heart sounds: Normal heart sounds.  ?Pulmonary:  ?   Effort: Pulmonary effort is normal. No respiratory distress.  ?   Breath sounds: Normal breath sounds.  ?   Comments: Clear Bl. Speaks in fill sentences without difficulty ?Abdominal:  ?    General: Bowel sounds are normal. There is no distension.  ?   Palpations: Abdomen is soft.  ?   Tenderness: There is abdominal tenderness. There is no right CVA tenderness, left CVA tenderness, guarding or rebound.  ?   Hernia: No hernia is present.  ?   Comments: Generalized tenderness  ?Musculoskeletal:     ?   General: Normal range of motion.  ?   Cervical back: Normal range of motion.  ?   Comments: Full ROM, no bony tenderness, compartments soft  ?Skin: ?   General: Skin is warm and dry.  ?   Capillary Refill: Capillary refill takes less than 2 seconds.  ?   Comments: No rash or lesions  ?Neurological:  ?   General: No focal deficit present.  ?   Mental Status: She is alert.  ?   Cranial Nerves: Cranial nerves  2-12 are intact.  ?   Sensory: Sensation is intact.  ?   Motor: Motor function is intact.  ?   Gait: Gait is intact.  ?   Comments: CN 2-12 grossly intact ?Ambulatory without difficulty  ?Psychiatric:     ?   Mood and Affect: Mood normal.  ? ? ?ED Results / Procedures / Treatments   ?Labs ?(all labs ordered are listed, but only abnormal results are displayed) ?Labs Reviewed  ?CBC - Abnormal; Notable for the following components:  ?    Result Value  ? WBC 20.7 (*)   ? Platelets 407 (*)   ? All other components within normal limits  ?URINALYSIS, ROUTINE W REFLEX MICROSCOPIC - Abnormal; Notable for the following components:  ? APPearance HAZY (*)   ? Glucose, UA >=500 (*)   ? Hgb urine dipstick SMALL (*)   ? Ketones, ur 5 (*)   ? Leukocytes,Ua MODERATE (*)   ? Bacteria, UA RARE (*)   ? All other components within normal limits  ?COMPREHENSIVE METABOLIC PANEL - Abnormal; Notable for the following components:  ? Sodium 130 (*)   ? Chloride 97 (*)   ? Glucose, Bld 369 (*)   ? All other components within normal limits  ?CBG MONITORING, ED - Abnormal; Notable for the following components:  ? Glucose-Capillary 355 (*)   ? All other components within normal limits  ?I-STAT VENOUS BLOOD GAS, ED - Abnormal; Notable  for the following components:  ? pO2, Ven 138 (*)   ? Bicarbonate 28.7 (*)   ? Sodium 130 (*)   ? All other components within normal limits  ?CBG MONITORING, ED - Abnormal; Notable for the following components

## 2021-07-08 LAB — URINE CULTURE

## 2021-07-16 ENCOUNTER — Telehealth (HOSPITAL_COMMUNITY): Payer: Self-pay | Admitting: *Deleted

## 2021-07-16 NOTE — Telephone Encounter (Signed)
Reaching out to patient to offer assistance regarding upcoming cardiac imaging study; pt verbalizes understanding of appt date/time, parking situation and where to check in, pre-test NPO status and medications ordered, and verified current allergies; name and call back number provided for further questions should they arise ? ?Larey Brick RN Navigator Cardiac Imaging ?[facility] ?[phone] office ?[phone] cell ? ?Patient to take 100mg  metoprolol tartrate two hours prior to her cardiac CT scan.  She is aware to arrive a 4pm. ?

## 2021-07-17 ENCOUNTER — Encounter (HOSPITAL_COMMUNITY): Payer: Self-pay

## 2021-07-17 ENCOUNTER — Ambulatory Visit (HOSPITAL_COMMUNITY)
Admission: RE | Admit: 2021-07-17 | Discharge: 2021-07-17 | Disposition: A | Discharge status: Home / Self Care | Payer: BLUE CROSS/BLUE SHIELD | Source: Ambulatory Visit | Attending: Cardiology | Admitting: Cardiology

## 2021-07-17 ENCOUNTER — Encounter: Payer: Self-pay | Admitting: Cardiology

## 2021-07-17 DIAGNOSIS — I2510 Atherosclerotic heart disease of native coronary artery without angina pectoris: Secondary | ICD-10-CM | POA: Insufficient documentation

## 2021-07-17 DIAGNOSIS — R079 Chest pain, unspecified: Secondary | ICD-10-CM | POA: Insufficient documentation

## 2021-07-17 DIAGNOSIS — M329 Systemic lupus erythematosus, unspecified: Secondary | ICD-10-CM | POA: Insufficient documentation

## 2021-07-17 HISTORY — DX: Atherosclerotic heart disease of native coronary artery without angina pectoris: I25.10

## 2021-07-17 MED ORDER — NITROGLYCERIN 0.4 MG SL SUBL
0.8000 mg | SUBLINGUAL_TABLET | Freq: Once | SUBLINGUAL | Status: AC
Start: 2021-07-17 — End: 2021-07-17
  Administered 2021-07-17: 0.8 mg via SUBLINGUAL

## 2021-07-17 MED ORDER — IOHEXOL 350 MG/ML SOLN
100.0000 mL | Freq: Once | INTRAVENOUS | Status: AC | PRN
  Administered 2021-07-17: 100 mL via INTRAVENOUS

## 2021-07-17 MED ORDER — NITROGLYCERIN 0.4 MG SL SUBL
SUBLINGUAL_TABLET | SUBLINGUAL | Status: AC
  Filled 2021-07-17: qty 2

## 2021-07-19 ENCOUNTER — Ambulatory Visit (HOSPITAL_COMMUNITY)
Admission: RE | Admit: 2021-07-19 | Discharge: 2021-07-19 | Disposition: A | Discharge status: Home / Self Care | Payer: BLUE CROSS/BLUE SHIELD | Source: Ambulatory Visit | Attending: Cardiology | Admitting: Cardiology

## 2021-07-19 ENCOUNTER — Telehealth: Payer: Self-pay | Admitting: *Deleted

## 2021-07-19 DIAGNOSIS — R42 Dizziness and giddiness: Secondary | ICD-10-CM | POA: Insufficient documentation

## 2021-07-19 DIAGNOSIS — M329 Systemic lupus erythematosus, unspecified: Secondary | ICD-10-CM

## 2021-07-19 DIAGNOSIS — R0602 Shortness of breath: Secondary | ICD-10-CM

## 2021-07-19 DIAGNOSIS — I10 Essential (primary) hypertension: Secondary | ICD-10-CM | POA: Insufficient documentation

## 2021-07-19 DIAGNOSIS — E785 Hyperlipidemia, unspecified: Secondary | ICD-10-CM

## 2021-07-19 DIAGNOSIS — R079 Chest pain, unspecified: Secondary | ICD-10-CM | POA: Insufficient documentation

## 2021-07-19 LAB — ECHOCARDIOGRAM COMPLETE
AR max vel: 2.92 cm2
AV Area VTI: 2.74 cm2
AV Area mean vel: 2.72 cm2
AV Mean grad: 4 mmHg
AV Peak grad: 7.3 mmHg
Ao pk vel: 1.35 m/s
Area-P 1/2: 4.8 cm2
Calc EF: 67.2 %
MV VTI: 2.5 cm2
S' Lateral: 2.8 cm
Single Plane A2C EF: 64.8 %
Single Plane A4C EF: 68.8 %

## 2021-07-19 NOTE — Progress Notes (Signed)
*  PRELIMINARY RESULTS* ?Echocardiogram ?2D Echocardiogram has been performed. ? ?Christy Velasquez ?07/19/2021, 3:51 PM ?

## 2021-07-19 NOTE — Telephone Encounter (Signed)
Coronary CTA showed minimal CAD less than 25% in the proximal LAD.  Her calcium score was 14.4.  Please have her come in for lab fasting lipid panel and ALT.  Start ASA 81 mg daily ?

## 2021-07-22 ENCOUNTER — Telehealth: Payer: Self-pay | Admitting: Cardiology

## 2021-07-22 ENCOUNTER — Other Ambulatory Visit (HOSPITAL_COMMUNITY)
Admission: RE | Admit: 2021-07-22 | Discharge: 2021-07-22 | Disposition: A | Discharge status: Home / Self Care | Payer: BLUE CROSS/BLUE SHIELD | Source: Ambulatory Visit | Attending: Cardiology | Admitting: Cardiology

## 2021-07-22 DIAGNOSIS — E785 Hyperlipidemia, unspecified: Secondary | ICD-10-CM

## 2021-07-22 LAB — LIPID PANEL
Cholesterol: 175 mg/dL (ref 0–200)
HDL: 48 mg/dL (ref >40)
LDL Cholesterol: 107 mg/dL — ABNORMAL HIGH (ref 0–99)
Total CHOL/HDL Ratio: 3.6 RATIO
Triglycerides: 98 mg/dL (ref <150)
VLDL: 20 mg/dL (ref 0–40)

## 2021-07-22 LAB — ALT: ALT: 18 U/L (ref 0–44)

## 2021-07-22 NOTE — Telephone Encounter (Signed)
Attempt to reach, left message to return call. 

## 2021-07-22 NOTE — Telephone Encounter (Signed)
Patient walked in the office regarding recent test and labs.   Please call with results and let patient if she needs a follow up appt.  ?

## 2021-07-23 ENCOUNTER — Telehealth: Payer: Self-pay

## 2021-07-23 DIAGNOSIS — E785 Hyperlipidemia, unspecified: Secondary | ICD-10-CM

## 2021-07-23 MED ORDER — ATORVASTATIN CALCIUM 10 MG PO TABS: 10.0000 mg | ORAL_TABLET | Freq: Every day | ORAL | 3 refills | Status: DC

## 2021-07-23 NOTE — Telephone Encounter (Signed)
Pt notified of changes through [place]. Pt verbalized understanding and had no questions or concerns at this time. ?

## 2021-07-23 NOTE — Telephone Encounter (Signed)
Left a message for pt to call office regarding lab results, mychart message sent.  ?

## 2021-07-23 NOTE — Telephone Encounter (Signed)
Per Dr. Mayford Knife: ? ?LDL goal < 70 due to coronary calcifications.  Start Atorvastatin 10mg  daily and repeat FLp and ALT in 6 weeks ? ?Echo showed normal heart function with mildly dilated LA, trivial leakiness of MV ? ?Normal carotid dopplers ? ?Patient notified and verbalized understanding. Pt had no questions or concerns at this time.  ?

## 2021-07-26 ENCOUNTER — Encounter (HOSPITAL_COMMUNITY): Payer: Self-pay | Admitting: *Deleted

## 2021-07-26 ENCOUNTER — Emergency Department (HOSPITAL_COMMUNITY)
Admission: EM | Admit: 2021-07-26 | Discharge: 2021-07-26 | Discharge status: Against Medical Advice | Payer: BLUE CROSS/BLUE SHIELD | Attending: Emergency Medicine | Admitting: Emergency Medicine

## 2021-07-26 DIAGNOSIS — Z5321 Procedure and treatment not carried out due to patient leaving prior to being seen by health care provider: Secondary | ICD-10-CM | POA: Insufficient documentation

## 2021-07-26 DIAGNOSIS — R5383 Other fatigue: Secondary | ICD-10-CM | POA: Diagnosis not present

## 2021-07-26 DIAGNOSIS — R531 Weakness: Secondary | ICD-10-CM | POA: Diagnosis present

## 2021-07-26 LAB — CBG MONITORING, ED: Glucose-Capillary: 381 mg/dL — ABNORMAL HIGH (ref 70–99)

## 2021-07-26 NOTE — ED Triage Notes (Signed)
Pt in c/o weakness and lethargy, pt reports taking 12 units of Lantus x 3/day, pt states, "I have a sweet tooth today. Symptom onset x 2, last A1c 15.1, A&Ox4 ?

## 2021-07-31 ENCOUNTER — Other Ambulatory Visit: Payer: Self-pay

## 2021-07-31 ENCOUNTER — Emergency Department (HOSPITAL_COMMUNITY)
Admission: EM | Admit: 2021-07-31 | Discharge: 2021-08-01 | Disposition: A | Discharge status: Home / Self Care | Payer: BLUE CROSS/BLUE SHIELD | Attending: Emergency Medicine | Admitting: Emergency Medicine

## 2021-07-31 ENCOUNTER — Encounter (HOSPITAL_COMMUNITY): Payer: Self-pay

## 2021-07-31 ENCOUNTER — Emergency Department (HOSPITAL_COMMUNITY): Payer: BLUE CROSS/BLUE SHIELD

## 2021-07-31 DIAGNOSIS — R519 Headache, unspecified: Secondary | ICD-10-CM | POA: Diagnosis present

## 2021-07-31 DIAGNOSIS — G43909 Migraine, unspecified, not intractable, without status migrainosus: Secondary | ICD-10-CM | POA: Diagnosis not present

## 2021-07-31 DIAGNOSIS — G43109 Migraine with aura, not intractable, without status migrainosus: Secondary | ICD-10-CM

## 2021-07-31 DIAGNOSIS — Z79899 Other long term (current) drug therapy: Secondary | ICD-10-CM | POA: Insufficient documentation

## 2021-07-31 LAB — CBC WITH DIFFERENTIAL/PLATELET
Abs Immature Granulocytes: 0.08 10*3/uL — ABNORMAL HIGH (ref 0.00–0.07)
Basophils Absolute: 0.1 10*3/uL (ref 0.0–0.1)
Basophils Relative: 1 %
Eosinophils Absolute: 0.2 10*3/uL (ref 0.0–0.5)
Eosinophils Relative: 1 %
HCT: 39.5 % (ref 36.0–46.0)
Hemoglobin: 12.9 g/dL (ref 12.0–15.0)
Immature Granulocytes: 1 %
Lymphocytes Relative: 30 %
Lymphs Abs: 4.9 10*3/uL — ABNORMAL HIGH (ref 0.7–4.0)
MCH: 30.3 pg (ref 26.0–34.0)
MCHC: 32.7 g/dL (ref 30.0–36.0)
MCV: 92.7 fL (ref 80.0–100.0)
Monocytes Absolute: 0.8 10*3/uL (ref 0.1–1.0)
Monocytes Relative: 5 %
Neutro Abs: 10.1 10*3/uL — ABNORMAL HIGH (ref 1.7–7.7)
Neutrophils Relative %: 62 %
Platelets: 434 10*3/uL — ABNORMAL HIGH (ref 150–400)
RBC: 4.26 MIL/uL (ref 3.87–5.11)
RDW: 13.8 % (ref 11.5–15.5)
WBC: 16.3 10*3/uL — ABNORMAL HIGH (ref 4.0–10.5)
nRBC: 0 % (ref 0.0–0.2)

## 2021-07-31 LAB — COMPREHENSIVE METABOLIC PANEL
ALT: 17 U/L (ref 0–44)
AST: 13 U/L — ABNORMAL LOW (ref 15–41)
Albumin: 3.4 g/dL — ABNORMAL LOW (ref 3.5–5.0)
Alkaline Phosphatase: 75 U/L (ref 38–126)
Anion gap: 8 (ref 5–15)
BUN: 7 mg/dL (ref 6–20)
CO2: 23 mmol/L (ref 22–32)
Calcium: 9.4 mg/dL (ref 8.9–10.3)
Chloride: 101 mmol/L (ref 98–111)
Creatinine, Ser: 0.48 mg/dL (ref 0.44–1.00)
GFR, Estimated: >60 mL/min (ref >60)
Glucose, Bld: 256 mg/dL — ABNORMAL HIGH (ref 70–99)
Potassium: 4.1 mmol/L (ref 3.5–5.1)
Sodium: 132 mmol/L — ABNORMAL LOW (ref 135–145)
Total Bilirubin: 0.6 mg/dL (ref 0.3–1.2)
Total Protein: 6.8 g/dL (ref 6.5–8.1)

## 2021-07-31 LAB — I-STAT BETA HCG BLOOD, ED (MC, WL, AP ONLY): I-stat hCG, quantitative: <5 m[IU]/mL (ref <5)

## 2021-07-31 LAB — RETICULOCYTES
Immature Retic Fract: 24 % — ABNORMAL HIGH (ref 2.3–15.9)
RBC.: 4.27 MIL/uL (ref 3.87–5.11)
Retic Count, Absolute: 80.3 10*3/uL (ref 19.0–186.0)
Retic Ct Pct: 1.9 % (ref 0.4–3.1)

## 2021-07-31 LAB — CBG MONITORING, ED: Glucose-Capillary: 256 mg/dL — ABNORMAL HIGH (ref 70–99)

## 2021-07-31 LAB — LIPASE, BLOOD: Lipase: 28 U/L (ref 11–51)

## 2021-07-31 LAB — TROPONIN I (HIGH SENSITIVITY): Troponin I (High Sensitivity): <2 ng/L (ref <18)

## 2021-07-31 MED ORDER — IOHEXOL 350 MG/ML SOLN
75.0000 mL | Freq: Once | INTRAVENOUS | Status: AC | PRN
  Administered 2021-07-31: 75 mL via INTRAVENOUS

## 2021-07-31 NOTE — ED Triage Notes (Signed)
Patient has multiple complaints.  Complains of headache, numbness to left arm, lupus flare, nausea vomiting, hyperglycemia, sickle cell and seizures.  ?

## 2021-07-31 NOTE — ED Provider Triage Note (Signed)
Emergency Medicine Provider Triage Evaluation Note ? ?Christy Velasquez , a 46 y.o. female  was evaluated in triage.  Pt complains of chest pain, migraine headache, neck pain, left arm numbness and weakness, lupus flare. ? ?Patient reports that she began having left-sided neck pain last night at 11 PM.  States that pain radiates down to her left arm.  Patient endorses associated numbness and weakness to left arm.  Denies any recent falls or injuries.  Pain is worse with touch and movement. ? ?Patient reports that chest pain started this morning at 10 AM.  Pain is located in her mid sternum and does not radiate.  Patient describes pain as sharp.  Patient endorses associated nausea, vomiting, and shortness of breath.  Denies any diaphoresis palpitations, lightheadedness, syncope, or dizziness. ? ?Patient denies any abdominal pain, dysuria, hematuria, urinary urgency. ? ?Review of Systems  ?Positive: See above ?Negative: See above ? ?Physical Exam  ?BP (!) 124/96 (BP Location: Right Arm)   Pulse 75   Temp 97.9 ?F (36.6 ?C) (Oral)   Resp 16   Ht 5\' 5"  (1.651 m)   Wt 94.3 kg   SpO2 100%   BMI 34.61 kg/m?  ?Gen:   Awake, no distress   ?Resp:  Normal effort, clear to auscultation bilaterally ?MSK:   Moves extremities without difficulty  ?Other:  CN II through XII intact.  Grip strength decreased to left side.  Patient hesitant to move left arm due to complaints of pain. ? ?+2 radial pulse bilaterally. ? ?Abdomen soft, nondistended, nontender with no guarding or rebound tenderness. ? ?Medical Decision Making  ?Medically screening exam initiated at 5:12 PM.  Appropriate orders placed.  Christy Velasquez was informed that the remainder of the evaluation will be completed by another provider, this initial triage assessment does not replace that evaluation, and the importance of remaining in the ED until their evaluation is complete. ? ?Patient is outside the stroke window at this time as well it is unclear if patient's  weakness is secondary to her pain.  Due to neck pain and headache in the setting of possible numbness will obtain CTA of head and neck.  ACS work-up initiated. ?  ? , PA-C ?07/31/21 1716 ? ?

## 2021-08-01 DIAGNOSIS — G43909 Migraine, unspecified, not intractable, without status migrainosus: Secondary | ICD-10-CM | POA: Diagnosis not present

## 2021-08-01 LAB — TROPONIN I (HIGH SENSITIVITY): Troponin I (High Sensitivity): <2 ng/L (ref <18)

## 2021-08-01 MED ORDER — METHYLPREDNISOLONE 4 MG PO TBPK: ORAL_TABLET | ORAL | 0 refills | Status: AC

## 2021-08-01 MED ORDER — DIPHENHYDRAMINE HCL 50 MG/ML IJ SOLN
25.0000 mg | Freq: Once | INTRAMUSCULAR | Status: AC
  Administered 2021-08-01: 25 mg via INTRAVENOUS
  Filled 2021-08-01: qty 1

## 2021-08-01 MED ORDER — KETOROLAC TROMETHAMINE 15 MG/ML IJ SOLN
15.0000 mg | Freq: Once | INTRAMUSCULAR | Status: AC
  Administered 2021-08-01: 15 mg via INTRAVENOUS
  Filled 2021-08-01: qty 1

## 2021-08-01 MED ORDER — PROCHLORPERAZINE EDISYLATE 10 MG/2ML IJ SOLN
10.0000 mg | Freq: Once | INTRAMUSCULAR | Status: AC
  Administered 2021-08-01: 10 mg via INTRAVENOUS
  Filled 2021-08-01: qty 2

## 2021-08-01 NOTE — Discharge Instructions (Signed)
Please follow-up with neurologist in the office.  Please let your family doctor know what happened and see when they want to see you in the office or if they want to change any of your medicines. ?

## 2021-08-01 NOTE — ED Provider Notes (Signed)
?[facility] EMERGENCY DEPARTMENT ?Provider Note ? ? ?CSN: [id number] ?Arrival date & time: 07/31/21  1623 ? ?  ? ?History ? ?Chief Complaint  ?Patient presents with  ? MULTIPLE COMPLAINTS  ? ? ?Christy Velasquez is a 46 y.o. female. ? ?46 yo F with a chief complaints of left-sided headache and left arm numbness and weakness.  This has been going on for couple days but got worse this evening.  She feels like she cannot really feel her hand and has been having trouble lifting up her arm.  Pain mostly to the left upper back and extends to the neck.  Denies trauma.  She has a history of lupus and so thinks maybe this is a lupus flare.  Has never had one like this before. ? ? ? ?  ? ?Home Medications ?Prior to Admission medications   ?Medication Sig Start Date End Date Taking? Authorizing Provider  ?methylPREDNISolone (MEDROL DOSEPAK) 4 MG TBPK tablet Day 1: 8mg  before breakfast, 4 mg after lunch, 4 mg after supper, and 8 mg at bedtime Day 2: 4 mg before breakfast, 4 mg after lunch, 4 mg  after supper, and 8 mg  at bedtime Day 3:  4 mg  before breakfast, 4 mg  after lunch, 4 mg after supper, and 4 mg  at bedtime Day 4: 4 mg  before breakfast, 4 mg  after lunch, and 4 mg at bedtime Day 5: 4 mg  before breakfast and 4 mg at bedtime Day 6: 4 mg  before breakfast 08/01/21  Yes 08/03/21, DO  ?atorvastatin (LIPITOR) 10 MG tablet Take 1 tablet (10 mg total) by mouth daily. 07/23/21   07/25/21, MD  ?azaTHIOprine (IMURAN) 50 MG tablet Take 100 mg by mouth daily. 06/04/21   [provider]  ?BD SYRINGE SLIP TIP 26G X 3/8" 1 ML MISC  06/04/21   [provider]  ?DULoxetine (CYMBALTA) 60 MG capsule Take 60 mg by mouth 2 (two) times daily.    [provider]  ?folic acid (FOLVITE) 1 MG tablet Take 1 mg by mouth daily. 06/04/21   [provider]  ?gabapentin (NEURONTIN) 800 MG tablet Take 800 mg by mouth 4 (four) times daily. 06/04/21   [provider]   ?HYDROcodone-acetaminophen ([place]) 7.5-325 MG tablet  02/01/14   [provider]  ?hydroxychloroquine (PLAQUENIL) 200 MG tablet Take 200 mg by mouth 2 (two) times daily. 06/04/21   [provider]  ?LANTUS SOLOSTAR 100 UNIT/ML Solostar Pen Inject into the skin. 07/08/21   [provider]  ?metFORMIN (GLUCOPHAGE) 500 MG tablet Take 500 mg by mouth 2 (two) times daily with a meal.    [provider]  ?methotrexate 25 MG/ML injection Inject into the skin once a week. 06/04/21   [provider]  ?metoprolol tartrate (LOPRESSOR) 100 MG tablet Take 100 mg 2 hours before Cardiac CT 07/04/21   07/06/21, MD  ?naproxen (NAPROSYN) 500 MG tablet Take 1 tablet (500 mg total) by mouth 2 (two) times daily as needed for moderate pain. ?Patient not taking: Reported on 07/17/2021 04/11/21   06/09/21, MD  ?nitrofurantoin, macrocrystal-monohydrate, (MACROBID) 100 MG capsule Take 1 capsule (100 mg total) by mouth 2 (two) times daily. 07/06/21   Henderly, Britni A, PA-C  ?omeprazole (PRILOSEC) 20 MG capsule Take 20 mg by mouth daily. 06/05/21   [provider]  ?ondansetron (ZOFRAN-ODT) 4 MG disintegrating tablet Take 1 tablet (4 mg total) by mouth  every 8 (eight) hours as needed for nausea or vomiting. 07/06/21   Henderly, Britni A, PA-C  ?phentermine (ADIPEX-P) 37.5 MG tablet Take 37.5 mg by mouth daily. 06/04/21   [provider]  ?predniSONE (DELTASONE) 20 MG tablet Take 60 mg by mouth daily. 05/21/21   [provider]  ?pregabalin (LYRICA) 150 MG capsule Take 150 mg by mouth daily. 12/27/19   [provider]  ?sitaGLIPtin-metformin (JANUMET) 50-500 MG per tablet Take 1 tablet by mouth 2 (two) times daily with a meal.    [provider]  ?SUMAtriptan (IMITREX) 100 MG tablet Take by mouth daily. 06/26/21   [provider]  ?topiramate (TOPAMAX) 50 MG tablet Take 50 mg by mouth 2 (two) times daily.     [provider]   ?traZODone (DESYREL) 50 MG tablet Take 50 mg by mouth at bedtime. 05/30/21   [provider]  ?WEEKLY-D 1.25 MG (50000 UT) capsule Take 50,000 Units by mouth once a week. 06/04/21   [provider]  ?   ? ?Allergies    ?Penicillins and Shellfish allergy   ? ?Review of Systems   ?Review of Systems ? ?Physical Exam ?Updated Vital Signs ?BP 112/78   Pulse 65   Temp 98.1 ?F (36.7 ?C) (Oral)   Resp (!) 24   Ht 5\' 5"  (1.651 m)   Wt 94.3 kg   SpO2 95%   BMI 34.61 kg/m?  ?Physical Exam ?Vitals and nursing note reviewed.  ?Constitutional:   ?   General: She is not in acute distress. ?   Appearance: She is well-developed. She is not diaphoretic.  ?HENT:  ?   Head: Normocephalic and atraumatic.  ?Eyes:  ?   Pupils: Pupils are equal, round, and reactive to light.  ?Cardiovascular:  ?   Rate and Rhythm: Normal rate and regular rhythm.  ?   Heart sounds: No murmur heard. ?  No friction rub. No gallop.  ?Pulmonary:  ?   Effort: Pulmonary effort is normal.  ?   Breath sounds: No wheezing or rales.  ?Abdominal:  ?   General: There is no distension.  ?   Palpations: Abdomen is soft.  ?   Tenderness: There is no abdominal tenderness.  ?Musculoskeletal:     ?   General: Tenderness present.  ?   Cervical back: Normal range of motion and neck supple.  ?   Comments: Exquisitely tender with palpation to the left trapezius.  No midline C-spine tenderness.  Negative Spurling's test.  Intact pulse distally.  Patient tells me she cannot feel anything to her hand and has some weakness with all motions with the fingers.  Is able to move appropriately in all nerve distributions but is slow to do so.  ?Skin: ?   General: Skin is warm and dry.  ?Neurological:  ?   Mental Status: She is alert and oriented to person, place, and time.  ?Psychiatric:     ?   Behavior: Behavior normal.  ? ? ?ED Results / Procedures / Treatments   ?Labs ?(all labs ordered are listed, but only abnormal results are displayed) ?Labs Reviewed   ?COMPREHENSIVE METABOLIC PANEL - Abnormal; Notable for the following components:  ?    Result Value  ? Sodium 132 (*)   ? Glucose, Bld 256 (*)   ? Albumin 3.4 (*)   ? AST 13 (*)   ? All other components within normal limits  ?CBC WITH DIFFERENTIAL/PLATELET - Abnormal; Notable for the following components:  ?  WBC 16.3 (*)   ? Platelets 434 (*)   ? Neutro Abs 10.1 (*)   ? Lymphs Abs 4.9 (*)   ? Abs Immature Granulocytes 0.08 (*)   ? All other components within normal limits  ?RETICULOCYTES - Abnormal; Notable for the following components:  ? Immature Retic Fract 24.0 (*)   ? All other components within normal limits  ?CBG MONITORING, ED - Abnormal; Notable for the following components:  ? Glucose-Capillary 256 (*)   ? All other components within normal limits  ?LIPASE, BLOOD  ?URINALYSIS, ROUTINE W REFLEX MICROSCOPIC  ?I-STAT BETA HCG BLOOD, ED ([facility], [facility], [facility] ONLY)  ?TROPONIN I (HIGH SENSITIVITY)  ?TROPONIN I (HIGH SENSITIVITY)  ? ? ?EKG ?EKG Interpretation ? ?Date/Time:  Wednesday July 31 2021 17:08:49 EDT ?Ventricular Rate:  70 ?PR Interval:  126 ?QRS Duration: 88 ?QT Interval:  374 ?QTC Calculation: 403 ?R Axis:   14 ?Text Interpretation: Normal sinus rhythm Normal ECG downsloping st segment in lead III no longer present Otherwise no significant change Confirmed by Melene Plan [phone]) on 08/01/2021 12:35:10 AM ? ?Radiology ?CT ANGIO HEAD NECK W WO CM ? ?Result Date: 07/31/2021 ?CLINICAL DATA:  Left-sided numbness EXAM: CT ANGIOGRAPHY HEAD AND NECK TECHNIQUE: Multidetector CT imaging of the head and neck was performed using the standard protocol during bolus administration of intravenous contrast. Multiplanar CT image reconstructions and MIPs were obtained to evaluate the vascular anatomy. Carotid stenosis measurements (when applicable) are obtained utilizing NASCET criteria, using the distal internal carotid diameter as the denominator. RADIATION DOSE REDUCTION: This exam was performed according to the departmental  dose-optimization program which includes automated exposure control, adjustment of the mA and/or kV according to patient size and/or use of iterative reconstruction technique. CONTRAST:  15mL OMNIPAQUE IOHEXOL 350 MG/ML SOLN COMPARISON:  10/23/2

## 2021-09-03 ENCOUNTER — Telehealth: Payer: Self-pay | Admitting: *Deleted

## 2021-09-03 ENCOUNTER — Other Ambulatory Visit: Payer: Self-pay | Admitting: Psychiatry

## 2021-09-03 ENCOUNTER — Ambulatory Visit: Payer: BLUE CROSS/BLUE SHIELD | Admitting: Psychiatry

## 2021-09-03 ENCOUNTER — Encounter: Payer: Self-pay | Admitting: Psychiatry

## 2021-09-03 VITALS — BP 112/72 | HR 73 | Ht 65.0 in | Wt 207.0 lb

## 2021-09-03 DIAGNOSIS — R519 Headache, unspecified: Secondary | ICD-10-CM | POA: Diagnosis not present

## 2021-09-03 DIAGNOSIS — G43119 Migraine with aura, intractable, without status migrainosus: Secondary | ICD-10-CM

## 2021-09-03 DIAGNOSIS — G40909 Epilepsy, unspecified, not intractable, without status epilepticus: Secondary | ICD-10-CM | POA: Diagnosis not present

## 2021-09-03 MED ORDER — EMGALITY 120 MG/ML ~~LOC~~ SOAJ: 2.0000 "pen " | Freq: Once | SUBCUTANEOUS | 0 refills | Status: AC

## 2021-09-03 MED ORDER — LORAZEPAM 0.5 MG PO TABS: ORAL_TABLET | ORAL | 0 refills | Status: AC

## 2021-09-03 MED ORDER — UBRELVY 100 MG PO TABS: 100.0000 mg | ORAL_TABLET | ORAL | 6 refills | Status: DC | PRN

## 2021-09-03 MED ORDER — NURTEC 75 MG PO TBDP
75.0000 mg | ORAL_TABLET | ORAL | 6 refills | Status: DC | PRN
Start: 2021-09-03 — End: 2021-09-05

## 2021-09-03 MED ORDER — ONDANSETRON HCL 8 MG PO TABS: 8.0000 mg | ORAL_TABLET | Freq: Three times a day (TID) | ORAL | 6 refills | Status: AC | PRN

## 2021-09-03 MED ORDER — EMGALITY 120 MG/ML ~~LOC~~ SOAJ
120.0000 mg | SUBCUTANEOUS | 6 refills | Status: DC
Start: 2021-09-03 — End: 2022-02-03

## 2021-09-03 NOTE — Telephone Encounter (Signed)
[employer] PA, Key: [id number], g [phone]. Your information has been sent to [facility]. PA Case: [id number], Approved, Coverage Starts on: 09/03/2021 12:00:00 AM, Coverage Ends on: 10/01/2021 12:00:00 AM

## 2021-09-03 NOTE — Patient Instructions (Addendum)
MRI brain EEG Start Emgality monthly for migraine prevention Start Bernita Raisin as needed for migraine. Take one pill at onset of headache, may repeat a dose in 2 hours if headache persists

## 2021-09-03 NOTE — Telephone Encounter (Addendum)
Received email, documents attached. Your information has been sent to [facility]. . Turnaround time for review of a PA request is dependent upon insurance plan and can range from 24 hours to 5 calendar days

## 2021-09-03 NOTE — Telephone Encounter (Signed)
[place], [place] Case: [id number], Status: Denied.

## 2021-09-03 NOTE — Telephone Encounter (Signed)
Nurtec PA, Key: [id number], office notes faxed to be attached to key.

## 2021-09-03 NOTE — Telephone Encounter (Signed)
Rx for Nurtec sent to her pharmacy, thanks
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
## 2021-09-03 NOTE — Telephone Encounter (Signed)
Bernita Raisin PA, Key: [id number]. Your information has been sent to [facility].

## 2021-09-04 ENCOUNTER — Encounter: Payer: Self-pay | Admitting: *Deleted

## 2021-09-04 NOTE — Telephone Encounter (Signed)
Approval letter from insurance has been faxed to pharmacy. Confirmation received.  

## 2021-09-04 NOTE — Telephone Encounter (Signed)
Approval letter from insurance has been faxed to pharmacy. Confirmation received.

## 2021-09-04 NOTE — Telephone Encounter (Signed)
Nurtec PA Case: [id number], Status: Approved, Coverage Starts on: 09/03/2021 12:00:00 AM, Coverage Ends on: 09/03/2022 12:00:00 AM. Sent patient my chart to advise of med change from [place] to nurtec and approval.

## 2021-09-05 ENCOUNTER — Telehealth: Payer: Self-pay | Admitting: Psychiatry

## 2021-09-05 ENCOUNTER — Encounter: Payer: Self-pay | Admitting: *Deleted

## 2021-09-05 ENCOUNTER — Other Ambulatory Visit: Payer: Self-pay | Admitting: Psychiatry

## 2021-09-05 ENCOUNTER — Telehealth: Payer: Self-pay | Admitting: *Deleted

## 2021-09-05 MED ORDER — REYVOW 50 MG PO TABS
50.0000 mg | ORAL_TABLET | ORAL | 6 refills | Status: DC | PRN
Start: 2021-09-05 — End: 2023-07-30

## 2021-09-05 MED ORDER — BUTALBITAL-APAP-CAFFEINE 50-300-40 MG PO CAPS: 1.0000 | ORAL_CAPSULE | ORAL | 5 refills | Status: AC | PRN

## 2021-09-05 NOTE — Telephone Encounter (Signed)
See telephone note dated 09/03/21, message sent to MD>

## 2021-09-05 NOTE — Telephone Encounter (Signed)
Reyvow Approved, Coverage Starts on: 09/05/2021 12:00:00 AM, Coverage Ends on: 09/05/2022 12:00:00 AM. Sent my chart to advise her of approval.

## 2021-09-05 NOTE — Telephone Encounter (Signed)
Received my chart from patient: My insurance didn't pay for the another one she sent in.  could you see if they approve Imitrex please.  I called [facility], spoke with [facility] pharmacist who stated [employer] did approve of Nurtec but her co pay is $470. She is not eligible for savings card because she also has Medicaid.  He had recommended Imitrex to her.  Sent to MD.

## 2021-09-05 NOTE — Telephone Encounter (Addendum)
Patient messaged saying pharmacy told her reyvow is not approved. Called pharmacy, spoke with Sherrine Maples who stated reyvow co pay $[phone]. He asked if I could use her Medicaid insurance, [employer] instead of her commercial. Started new Reyvow PA, key [id number]. Faxed notes to be attached to key. Received e mail: documents attached. PA sent to plan. You will be notified of the determination electronically and via fax.

## 2021-09-05 NOTE — Telephone Encounter (Signed)
She can't take Imitrex due to her CAD. I put in a prescription for Reyvow, we'll see if this is more affordable. Thanks

## 2021-09-05 NOTE — Telephone Encounter (Addendum)
Reyvow PA,  Key: [id number], [id number]. Your information has been sent to [facility].  Turnaround time for review of a PA request is dependent upon insurance plan and can range from 24 hours to 5 calendar days.

## 2021-09-05 NOTE — Telephone Encounter (Signed)
Pt has called to report that Nurtec is not covered thru her insurance, she would like to see if Imitrex can be ran thru her insurance, please call

## 2021-09-09 ENCOUNTER — Ambulatory Visit (INDEPENDENT_AMBULATORY_CARE_PROVIDER_SITE_OTHER): Payer: BLUE CROSS/BLUE SHIELD | Admitting: Neurology

## 2021-09-09 DIAGNOSIS — G40909 Epilepsy, unspecified, not intractable, without status epilepticus: Secondary | ICD-10-CM | POA: Diagnosis not present

## 2021-09-09 NOTE — Telephone Encounter (Signed)
Reyvow has been approved as of 09/05/2021 through [place]. Approval is good from 09/05/2021-09/05/2022.  Approval information has been sent the pt's pharmacy as well.

## 2021-09-09 NOTE — Procedures (Signed)
    History:  46 year old woman with seizure like activity   EEG classification:  Awake and asleep  Description of the recording: The background rhythms of this recording consists of a fairly well modulated medium amplitude background activity of 10 Hz. As the record progresses, the patient initially is in the waking state, but appears to enter the early stage II sleep during the recording, with rudimentary sleep spindles and vertex sharp wave activity seen. During the wakeful state, photic stimulation is performed, and no abnormal responses were seen. Hyperventilation was also performed, no abnormal response seen. No epileptiform discharges seen during this recording. There was no focal slowing. EKG monitor shows no evidence of cardiac rhythm abnormalities with a heart rate of 72.  Abnormality: None   Impression: This is a normal EEG recording in the waking and sleeping state. No evidence interictal epileptiform discharges were seen at any time during the recording.  A normal EEG does not exclude a diagnosis of epilepsy.    Windell Norfolk, MD [facility]

## 2021-09-10 ENCOUNTER — Other Ambulatory Visit: Payer: Self-pay | Admitting: Psychiatry

## 2021-09-10 ENCOUNTER — Encounter (INDEPENDENT_AMBULATORY_CARE_PROVIDER_SITE_OTHER): Payer: BLUE CROSS/BLUE SHIELD | Admitting: Psychiatry

## 2021-09-10 ENCOUNTER — Encounter: Payer: Self-pay | Admitting: Psychiatry

## 2021-09-10 DIAGNOSIS — R251 Tremor, unspecified: Secondary | ICD-10-CM

## 2021-09-10 NOTE — Telephone Encounter (Signed)

## 2021-09-25 ENCOUNTER — Telehealth: Payer: Self-pay | Admitting: Psychiatry

## 2021-09-25 NOTE — Telephone Encounter (Signed)
[employer] Berkley Harvey: [id number] exp. 09/25/21-10/24/21 medicaid NPR sent to [facility]

## 2021-09-26 ENCOUNTER — Emergency Department (HOSPITAL_COMMUNITY): Payer: BLUE CROSS/BLUE SHIELD

## 2021-09-26 ENCOUNTER — Other Ambulatory Visit: Payer: Self-pay

## 2021-09-26 ENCOUNTER — Emergency Department (HOSPITAL_COMMUNITY)
Admission: EM | Admit: 2021-09-26 | Discharge: 2021-09-27 | Disposition: A | Discharge status: Home / Self Care | Payer: BLUE CROSS/BLUE SHIELD | Attending: Emergency Medicine | Admitting: Emergency Medicine

## 2021-09-26 ENCOUNTER — Encounter (HOSPITAL_COMMUNITY): Payer: Self-pay | Admitting: Emergency Medicine

## 2021-09-26 DIAGNOSIS — Z7984 Long term (current) use of oral hypoglycemic drugs: Secondary | ICD-10-CM | POA: Diagnosis not present

## 2021-09-26 DIAGNOSIS — D72829 Elevated white blood cell count, unspecified: Secondary | ICD-10-CM | POA: Insufficient documentation

## 2021-09-26 DIAGNOSIS — R519 Headache, unspecified: Secondary | ICD-10-CM | POA: Insufficient documentation

## 2021-09-26 DIAGNOSIS — R251 Tremor, unspecified: Secondary | ICD-10-CM | POA: Insufficient documentation

## 2021-09-26 DIAGNOSIS — R531 Weakness: Secondary | ICD-10-CM | POA: Insufficient documentation

## 2021-09-26 DIAGNOSIS — H538 Other visual disturbances: Secondary | ICD-10-CM | POA: Diagnosis not present

## 2021-09-26 DIAGNOSIS — R29898 Other symptoms and signs involving the musculoskeletal system: Secondary | ICD-10-CM

## 2021-09-26 LAB — CBC WITH DIFFERENTIAL/PLATELET
Abs Immature Granulocytes: 0 10*3/uL (ref 0.00–0.07)
Basophils Absolute: 0.2 10*3/uL — ABNORMAL HIGH (ref 0.0–0.1)
Basophils Relative: 1 %
Eosinophils Absolute: 0.3 10*3/uL (ref 0.0–0.5)
Eosinophils Relative: 2 %
HCT: 40.1 % (ref 36.0–46.0)
Hemoglobin: 13.6 g/dL (ref 12.0–15.0)
Lymphocytes Relative: 46 %
Lymphs Abs: 7.2 10*3/uL — ABNORMAL HIGH (ref 0.7–4.0)
MCH: 31 pg (ref 26.0–34.0)
MCHC: 33.9 g/dL (ref 30.0–36.0)
MCV: 91.3 fL (ref 80.0–100.0)
Monocytes Absolute: 0.5 10*3/uL (ref 0.1–1.0)
Monocytes Relative: 3 %
Neutro Abs: 7.5 10*3/uL (ref 1.7–7.7)
Neutrophils Relative %: 48 %
Platelets: 386 10*3/uL (ref 150–400)
RBC: 4.39 MIL/uL (ref 3.87–5.11)
RDW: 13.2 % (ref 11.5–15.5)
WBC: 15.7 10*3/uL — ABNORMAL HIGH (ref 4.0–10.5)
nRBC: 0 % (ref 0.0–0.2)
nRBC: 0 /100 WBC

## 2021-09-26 LAB — BASIC METABOLIC PANEL
Anion gap: 12 (ref 5–15)
BUN: 7 mg/dL (ref 6–20)
CO2: 22 mmol/L (ref 22–32)
Calcium: 9.3 mg/dL (ref 8.9–10.3)
Chloride: 100 mmol/L (ref 98–111)
Creatinine, Ser: 0.52 mg/dL (ref 0.44–1.00)
GFR, Estimated: >60 mL/min (ref >60)
Glucose, Bld: 283 mg/dL — ABNORMAL HIGH (ref 70–99)
Potassium: 4 mmol/L (ref 3.5–5.1)
Sodium: 134 mmol/L — ABNORMAL LOW (ref 135–145)

## 2021-09-26 MED ORDER — PROCHLORPERAZINE EDISYLATE 10 MG/2ML IJ SOLN
10.0000 mg | INTRAMUSCULAR | Status: AC
  Administered 2021-09-26: 10 mg via INTRAVENOUS
  Filled 2021-09-26: qty 2

## 2021-09-26 MED ORDER — SODIUM CHLORIDE 0.9 % IV BOLUS
1000.0000 mL | Freq: Once | INTRAVENOUS | Status: AC
  Administered 2021-09-26: 1000 mL via INTRAVENOUS

## 2021-09-26 MED ORDER — DIPHENHYDRAMINE HCL 25 MG PO CAPS
25.0000 mg | ORAL_CAPSULE | Freq: Once | ORAL | Status: AC
  Administered 2021-09-26: 25 mg via ORAL
  Filled 2021-09-26: qty 1

## 2021-09-26 MED ORDER — GADOBUTROL 1 MMOL/ML IV SOLN
9.0000 mL | Freq: Once | INTRAVENOUS | Status: AC | PRN
  Administered 2021-09-27: 9 mL via INTRAVENOUS
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
## 2021-09-26 NOTE — ED Triage Notes (Signed)
Patient here with complaint of headache, left arm weakness, and blurred vision that started last night at approximately 2200. Patient has history of migraines, states the she takes medication for migraines but states it does not help. Patient is tearful, has left arm drift, no facial droop, weak left hand grip, reports numbness in left arm.

## 2021-09-26 NOTE — ED Provider Triage Note (Signed)
Emergency Medicine Provider Triage Evaluation Note  Christy Velasquez , a 46 y.o. female  was evaluated in triage.  Pt complains of severe headache starting at 10 PM last night.  She has had an associated light sensitivity, vomiting.  She took her home migraine medication without improvement.  Her left arm is weak.  She is walking well.  Tearful in triage.  Review of Systems  Positive: Headache, vomiting Negative: Fever, neck pain  Physical Exam  BP (!) 150/92   Pulse 80   Temp 98 F (36.7 C) (Oral)   Resp 16   SpO2 100%  Gen:   Awake, patient is tearful and crying Resp:  Normal effort  MSK:   Moves extremities without difficulty  Other:  Patient with trace weakness of her left upper extremity compared to the right, normal speech, no facial droop  Suspect complex migraine, no code stroke due to out of window, van negative  Medical Decision Making  Medically screening exam initiated at 4:25 PM.  Appropriate orders placed.  Christy Velasquez was informed that the remainder of the evaluation will be completed by another provider, this initial triage assessment does not replace that evaluation, and the importance of remaining in the ED until their evaluation is complete.   Renne Crigler, PA-C 09/26/21 1627

## 2021-09-26 NOTE — Discharge Instructions (Signed)
Please follow-up with your neurologist.  You are scheduled for an inpatient evaluation of your shaking spells next month.  Please make sure that you follow directions given to you by your neurologist for this.  Please take all your medications as prescribed.

## 2021-09-27 DIAGNOSIS — R531 Weakness: Secondary | ICD-10-CM | POA: Diagnosis not present

## 2021-09-27 NOTE — ED Provider Notes (Signed)
Results for orders placed or performed during the hospital encounter of 09/26/21  CBC with Differential  Result Value Ref Range   WBC 15.7 (H) 4.0 - 10.5 K/uL   RBC 4.39 3.87 - 5.11 MIL/uL   Hemoglobin 13.6 12.0 - 15.0 g/dL   HCT 40.9 81.1 - 91.4 %   MCV 91.3 80.0 - 100.0 fL   MCH 31.0 26.0 - 34.0 pg   MCHC 33.9 30.0 - 36.0 g/dL   RDW 78.2 95.6 - 21.3 %   Platelets 386 150 - 400 K/uL   nRBC 0.0 0.0 - 0.2 %   Neutrophils Relative % 48 %   Neutro Abs 7.5 1.7 - 7.7 K/uL   Lymphocytes Relative 46 %   Lymphs Abs 7.2 (H) 0.7 - 4.0 K/uL   Monocytes Relative 3 %   Monocytes Absolute 0.5 0.1 - 1.0 K/uL   Eosinophils Relative 2 %   Eosinophils Absolute 0.3 0.0 - 0.5 K/uL   Basophils Relative 1 %   Basophils Absolute 0.2 (H) 0.0 - 0.1 K/uL   nRBC 0 0 /100 WBC   Abs Immature Granulocytes 0.00 0.00 - 0.07 K/uL  Basic metabolic panel  Result Value Ref Range   Sodium 134 (L) 135 - 145 mmol/L   Potassium 4.0 3.5 - 5.1 mmol/L   Chloride 100 98 - 111 mmol/L   CO2 22 22 - 32 mmol/L   Glucose, Bld 283 (H) 70 - 99 mg/dL   BUN 7 6 - 20 mg/dL   Creatinine, Ser 0.86 0.44 - 1.00 mg/dL   Calcium 9.3 8.9 - 57.8 mg/dL   GFR, Estimated >46 >96 mL/min   Anion gap 12 5 - 15   MR BRAIN W WO CONTRAST  Result Date: 09/27/2021 CLINICAL DATA:  Headaches and left arm weakness EXAM: MRI HEAD WITHOUT AND WITH CONTRAST MRI CERVICAL SPINE WITHOUT AND WITH CONTRAST TECHNIQUE: Multiplanar, multiecho pulse sequences of the brain and surrounding structures, and cervical spine, to include the craniocervical junction and cervicothoracic junction, were obtained without and with intravenous contrast. CONTRAST:  9mL GADAVIST GADOBUTROL 1 MMOL/ML IV SOLN COMPARISON:  01/27/2014 FINDINGS: MRI HEAD FINDINGS Brain: No acute infarct, mass effect or extra-axial collection. No acute or chronic hemorrhage. Normal white matter signal, parenchymal volume and CSF spaces. The midline structures are normal. There is no abnormal  contrast enhancement. Vascular: Major flow voids are preserved. Skull and upper cervical spine: Normal calvarium and skull base. Visualized upper cervical spine and soft tissues are normal. Sinuses/Orbits:No paranasal sinus fluid levels or advanced mucosal thickening. No mastoid or middle ear effusion. Normal orbits. MRI CERVICAL SPINE FINDINGS Alignment: Physiologic. Vertebrae: No fracture, evidence of discitis, or bone lesion. Cord: Normal signal and morphology. Posterior Fossa, vertebral arteries, paraspinal tissues: Negative. Disc levels: No spinal canal or neural foraminal stenosis. IMPRESSION: Normal MRI of the brain and cervical spine. Electronically Signed   By: Deatra Robinson M.D.   On: 09/27/2021 00:43   MR Cervical Spine W or Wo Contrast  Result Date: 09/27/2021 CLINICAL DATA:  Headaches and left arm weakness EXAM: MRI HEAD WITHOUT AND WITH CONTRAST MRI CERVICAL SPINE WITHOUT AND WITH CONTRAST TECHNIQUE: Multiplanar, multiecho pulse sequences of the brain and surrounding structures, and cervical spine, to include the craniocervical junction and cervicothoracic junction, were obtained without and with intravenous contrast. CONTRAST:  9mL GADAVIST GADOBUTROL 1 MMOL/ML IV SOLN COMPARISON:  01/27/2014 FINDINGS: MRI HEAD FINDINGS Brain: No acute infarct, mass effect or extra-axial collection. No acute or chronic hemorrhage. Normal white  matter signal, parenchymal volume and CSF spaces. The midline structures are normal. There is no abnormal contrast enhancement. Vascular: Major flow voids are preserved. Skull and upper cervical spine: Normal calvarium and skull base. Visualized upper cervical spine and soft tissues are normal. Sinuses/Orbits:No paranasal sinus fluid levels or advanced mucosal thickening. No mastoid or middle ear effusion. Normal orbits. MRI CERVICAL SPINE FINDINGS Alignment: Physiologic. Vertebrae: No fracture, evidence of discitis, or bone lesion. Cord: Normal signal and morphology.  Posterior Fossa, vertebral arteries, paraspinal tissues: Negative. Disc levels: No spinal canal or neural foraminal stenosis. IMPRESSION: Normal MRI of the brain and cervical spine. Electronically Signed   By: Deatra Robinson M.D.   On: 09/27/2021 00:43   CT Head Wo Contrast  Result Date: 09/26/2021 CLINICAL DATA:  Acute neuro deficit. Headache, left arm weakness and blurred vision. History of migraine headache. EXAM: CT HEAD WITHOUT CONTRAST TECHNIQUE: Contiguous axial images were obtained from the base of the skull through the vertex without intravenous contrast. RADIATION DOSE REDUCTION: This exam was performed according to the departmental dose-optimization program which includes automated exposure control, adjustment of the mA and/or kV according to patient size and/or use of iterative reconstruction technique. COMPARISON:  CT head 07/31/2021 FINDINGS: Brain: No evidence of acute infarction, hemorrhage, hydrocephalus, extra-axial collection or mass lesion/mass effect. Vascular: Negative for hyperdense vessel Skull: Negative Sinuses/Orbits: Negative Other: None IMPRESSION: Normal CT head Electronically Signed   By: Marlan Palau M.D.   On: 09/26/2021 17:58   EEG adult  Result Date: 09/09/2021 Windell Norfolk, MD     09/09/2021  9:38 PM History: 46 year old woman with seizure like activity EEG classification:  Awake and asleep Description of the recording: The background rhythms of this recording consists of a fairly well modulated medium amplitude background activity of 10 Hz. As the record progresses, the patient initially is in the waking state, but appears to enter the early stage II sleep during the recording, with rudimentary sleep spindles and vertex sharp wave activity seen. During the wakeful state, photic stimulation is performed, and no abnormal responses were seen. Hyperventilation was also performed, no abnormal response seen. No epileptiform discharges seen during this recording. There was no  focal slowing. EKG monitor shows no evidence of cardiac rhythm abnormalities with a heart rate of 72. Abnormality: None Impression: This is a normal EEG recording in the waking and sleeping state. No evidence interictal epileptiform discharges were seen at any time during the recording.  A normal EEG does not exclude a diagnosis of epilepsy. Windell Norfolk, MD [facility]     12:56 AM MRI's negative.  States she is feeling better.  Stable for discharge to follow-up with her OP neurologist.  Can return here for new concerns.   Garlon Hatchet, PA-C 09/27/21 0110    Nira Conn, MD 09/27/21 [phone]

## 2021-09-30 ENCOUNTER — Telehealth: Payer: Self-pay | Admitting: Psychiatry

## 2021-09-30 ENCOUNTER — Other Ambulatory Visit: Payer: Self-pay | Admitting: Psychiatry

## 2021-09-30 DIAGNOSIS — R251 Tremor, unspecified: Secondary | ICD-10-CM

## 2021-10-02 ENCOUNTER — Telehealth: Payer: Self-pay | Admitting: Psychiatry

## 2021-10-02 NOTE — Telephone Encounter (Signed)
Referral for Neurology sent to [facility] Neurology [phone].

## 2021-10-14 ENCOUNTER — Inpatient Hospital Stay: Admission: RE | Admit: 2021-10-14 | Payer: BLUE CROSS/BLUE SHIELD | Source: Ambulatory Visit | Admitting: Neurology

## 2021-10-15 ENCOUNTER — Telehealth: Payer: Self-pay | Admitting: *Deleted

## 2021-10-15 ENCOUNTER — Encounter: Payer: Self-pay | Admitting: *Deleted

## 2021-10-15 NOTE — Telephone Encounter (Signed)
Called patient, phone rang twice then stopped but no one responded. Sent my chart with MRI results.

## 2021-10-15 NOTE — Telephone Encounter (Signed)
Brain MRI is normal other than some evidence of sinus inflammation on the left side. She can let her family doctor know if she's experiencing any sinus symptoms like congestion or runny nose

## 2021-10-15 NOTE — Telephone Encounter (Signed)
Received report MRI brain form [facility]. Placed on Dr Quentin Mulling desk for review.

## 2022-02-03 ENCOUNTER — Ambulatory Visit (INDEPENDENT_AMBULATORY_CARE_PROVIDER_SITE_OTHER): Payer: BLUE CROSS/BLUE SHIELD | Admitting: Psychiatry

## 2022-02-03 ENCOUNTER — Telehealth: Payer: Self-pay | Admitting: Psychiatry

## 2022-02-03 VITALS — BP 113/74 | HR 77 | Ht 65.0 in | Wt 202.2 lb

## 2022-02-03 DIAGNOSIS — R0683 Snoring: Secondary | ICD-10-CM

## 2022-02-03 DIAGNOSIS — R251 Tremor, unspecified: Secondary | ICD-10-CM | POA: Diagnosis not present

## 2022-02-03 MED ORDER — EMGALITY 120 MG/ML ~~LOC~~ SOAJ: 120.0000 mg | SUBCUTANEOUS | 6 refills | Status: AC

## 2022-02-03 MED ORDER — UBRELVY 100 MG PO TABS: 100.0000 mg | ORAL_TABLET | ORAL | 6 refills | Status: AC | PRN

## 2022-02-03 MED ORDER — ATORVASTATIN CALCIUM 10 MG PO TABS: 10.0000 mg | ORAL_TABLET | Freq: Every day | ORAL | 0 refills | Status: AC

## 2022-02-03 MED ORDER — EMGALITY 120 MG/ML ~~LOC~~ SOAJ: 240.0000 mg | Freq: Once | SUBCUTANEOUS | 0 refills | Status: AC

## 2022-02-03 NOTE — Telephone Encounter (Signed)
Referral sent to [facility] Neurology, phone # [phone].

## 2022-02-03 NOTE — Progress Notes (Signed)
CC:  headaches  Follow-up Visit  Last visit: 08/24/21  Brief HPI: 46 year old female with a history of CAD, HLD, GERD, DM2, SLE, anxiety, depression who follows in clinic for migraines and shaking episodes.  At her last visit, MRI brain and EEG were ordered. She was started on Emgality for migraine prevention and Ubrelvy for rescue.  Interval History: Headaches are about the same as her last visit. Emgality was approved but she never heard from her pharmacy so she never picked it up.  Bernita Raisin was denied by insurance so she was prescribed Nurtec. Nurtec was ineffective so Reyvow was prescribed for rescue. This has also not been effective. She is taking Tylenol as needed but this is not particularly helpful. Brain and C-spine MRI 09/26/21 were normal.  Routine EEG was normal. She continued to have episodes and was referred for evaluation under an epilepsy monitoring unit. Insurance would only pay for a location in [place], so she was referred to a neurologist in [place]. However they did not have access to an EMU. She is continuing to have shaking spells.  She reports frequently waking up choking and gasping for air. She is excessively sleepy during the day. She has never had a sleep study.   Current Headache Regimen: Preventative: none Abortive: Nurtec, Reyvow   Prior Therapies                                  Imitrex 100 mg PRN - contraindicated due to CAD Lyrica 150 mg daily Gabapentin 800 mg four times a day Cymbalta 60 mg daily Topamax 50 mg BID - rash Amitriptyline Emgality Reyvow - lack of efficacy Nurtec - lack of efficacy  Physical Exam:   Vital Signs: BP 113/74   Pulse 77   Ht 5\' 5"  (1.651 m)   Wt 202 lb 4 oz (91.7 kg)   BMI 33.66 kg/m  GENERAL:  well appearing, in no acute distress, alert  SKIN:  Color, texture, turgor normal. No rashes or lesions HEAD:  Normocephalic/atraumatic. RESP: normal respiratory effort MSK:  No gross joint deformities.    NEUROLOGICAL: Mental Status: Alert, oriented to person, place and time, Follows commands, and Speech fluent and appropriate. Cranial Nerves: PERRL, face symmetric, no dysarthria, hearing grossly intact Motor: moves all extremities equally Gait: normal-based.  IMPRESSION: 46 year old female with a history of CAD, HLD, GERD, DM2, SLE, anxiety, depression who presents for follow up of migraines and shaking episodes. Neurological workup including MRI brain, MRI C-spine, and EEG were unremarkable. Will send in new rx for Emgality as she was not aware this was approved by insurance. Will try Ubrelvy for rescue as she has failed both Nurtec and Reyvow, and triptans are contraindicated due to CAD. Referral to Sleep team placed to evaluate for possible OSA. She would likely benefit from EMU admission given continued frequent episodes of shaking spells which have not been captured on routine EEG. Will refer to [place] [facility] [place] and her insurance will not cover this in [place].  She reports that she ran out of her statin 2 months ago and has not been taking it. Has a follow up with her PCP next month. Will send in one refill to bridge her until she is able to follow up with her PCP.  PLAN: -Prevention: Start Emgality 120 mg monthly -Rescue: Start Ubrelvy 100 mg PRN -Referral to Sleep team to evaluate for OSA -Referral to [place]  [facility] [place] to evaluate for potential EMU admission   Follow-up: 3 months  I spent a total of 30 minutes on the date of the service. Headache education was done. Discussed treatment options including preventive and acute medications. Discussed medication side effects, adverse reactions and drug interactions. Written educational materials and patient instructions outlining all of the above were given.  Genia Harold, MD 02/03/22 1:58 PM

## 2022-02-04 ENCOUNTER — Telehealth: Payer: Self-pay | Admitting: *Deleted

## 2022-02-04 NOTE — Telephone Encounter (Signed)
Roselyn MeierDrema Balzarine PA Case: [id number], Status: Approved, Coverage Starts on: 02/04/2022 12:00:00 AM, Coverage Ends on: 02/04/2023 12:00:00 AM.

## 2022-02-04 NOTE — Telephone Encounter (Signed)
Emgality 2 pens PA Case: [id number], Status: Approved, Coverage Starts on: 02/04/2022 12:00:00 AM, Coverage Ends on: 03/04/2022 12:00:00 AM.

## 2022-02-04 NOTE — Telephone Encounter (Signed)
Emgality PA< Key: [id number], [id number]. Your information has been sent to [facility].

## 2022-02-04 NOTE — Telephone Encounter (Signed)
Notes attached. Your information has been sent to [facility].

## 2022-02-04 NOTE — Telephone Encounter (Signed)
Roselyn Meier PA, Key: [id number]  [id number]. office notes faxed to be attached to key.

## 2022-05-06 ENCOUNTER — Ambulatory Visit: Payer: BLUE CROSS/BLUE SHIELD | Admitting: Psychiatry

## 2022-05-06 ENCOUNTER — Encounter: Payer: Self-pay | Admitting: Psychiatry

## 2022-05-06 NOTE — Progress Notes (Deleted)
   CC:  migraines, shaking episodes  Follow-up Visit  Last visit: 02/03/22  Brief HPI: 47 year old female with a history of CAD, HLD, GERD, DM2, SLE, anxiety, depression who follows in clinic for migraines and shaking episodes. MRI brain and C-spine 09/26/21 were unremarkable. EEG 09/09/21 was normal.  At her last visit she was started on Emgality for migraine prevention and Ubrelvy for rescue. She was referred to [place] for evaluation of shaking spells and possible EMU evaluation (insurance would not cover in [place]). She was referred to Sleep for OSA evaluation.  Interval History: Headaches***Emgality***Ubrelvy***  Shaking***[employer] [facility]***  Sleep***  Prior Therapies                                  Prevention: Lyrica 150 mg daily Gabapentin 800 mg four times a day Cymbalta 60 mg daily Topamax 50 mg BID - rash Amitriptyline Emgality  Rescue: Imitrex 100 mg PRN - contraindicated due to CAD Reyvow - lack of efficacy Nurtec - lack of efficacy  Physical Exam:  Vital Signs: There were no vitals taken for this visit. GENERAL:  well appearing, in no acute distress, alert  SKIN:  Color, texture, turgor normal. No rashes or lesions HEAD:  Normocephalic/atraumatic. RESP: normal respiratory effort MSK:  No gross joint deformities.   NEUROLOGICAL: Mental Status: Alert, oriented to person, place and time, Follows commands, and Speech fluent and appropriate. Cranial Nerves: PERRL, face symmetric, no dysarthria, hearing grossly intact Motor: moves all extremities equally Gait: normal-based.  IMPRESSION: ***  PLAN: ***   Follow-up: ***  I spent a total of *** minutes on the date of the service. Discussed medication side effects, adverse reactions and drug interactions. Written educational materials and patient instructions outlining all of the above were given.  Genia Harold, MD

## 2022-05-22 ENCOUNTER — Telehealth: Payer: Self-pay | Admitting: Pharmacy Technician

## 2022-05-22 NOTE — Telephone Encounter (Signed)
Patient Advocate Encounter   Received notification that prior authorization for Emgality 120MG/ML auto-injectors (migraine) is required.   PA submitted on 05/22/2022 Key [id number] Status is pending       Lyndel Safe, [place] Patient Advocate Specialist [place] Patient Advocate Team Direct Number: [phone]  Fax: [phone]

## 2022-05-23 NOTE — Telephone Encounter (Signed)
Patient Advocate Encounter  Prior Authorization for [employer] 120MG/ML auto-injectors (migraine) has been approved.    PA# [id number] Effective dates: 05/22/2022 through 05/22/2023      Lyndel Safe, [place] Patient Advocate Specialist [place] Patient Advocate Team Direct Number: [phone]  Fax: [phone]

## 2022-08-16 IMAGING — CT CT HEAD W/O CM
3 series · 16 of 47 positions shown, 19 images · non-contrast
Comparison: CT head 07/31/2021

CLINICAL DATA: Acute neuro deficit. Headache, left arm weakness and
blurred vision. History of migraine headache.



[Series 3: head 5.0 h30s · axial · 0.43mm/px · z∈[-143,-13]mm · 10 of 32 slices shown, 13 images]
[im 3/32  brain]
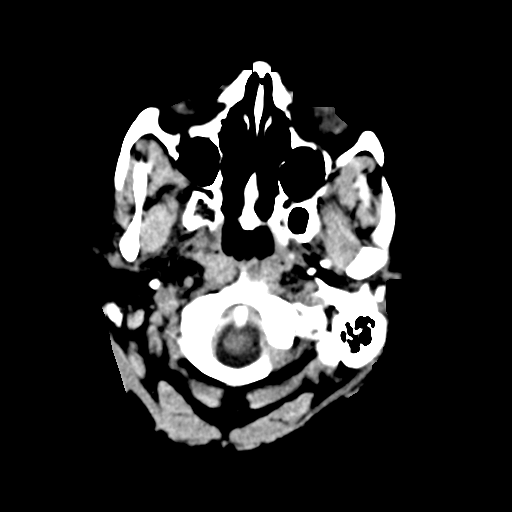
[im 3/32  bone]
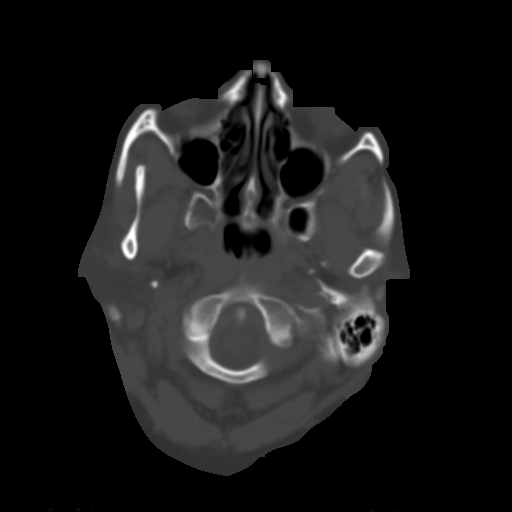
[im 6/32  brain]
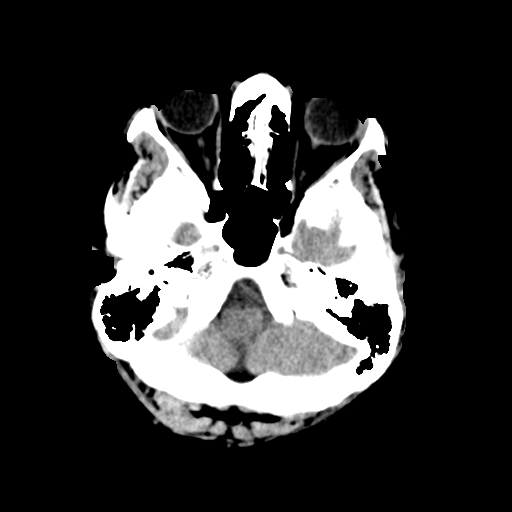
[im 9/32  brain]
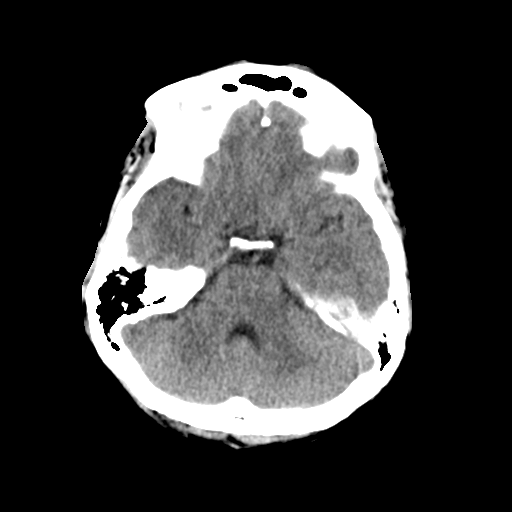
[im 11/32  brain]
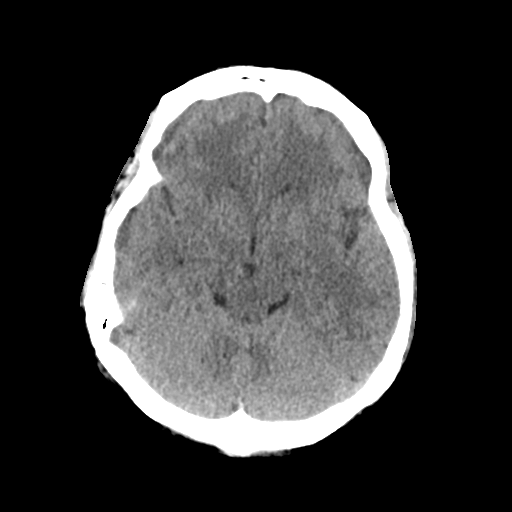
[im 14/32  brain]
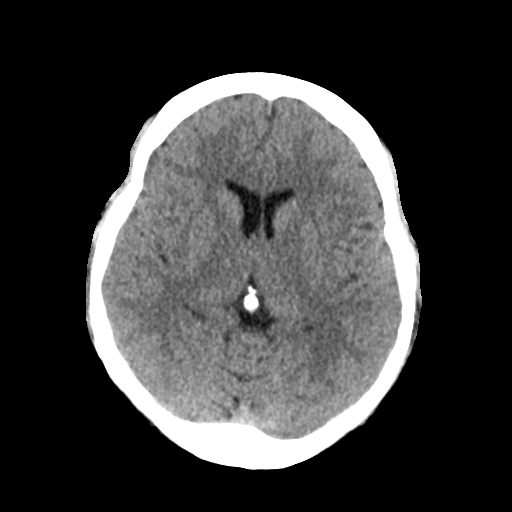
[im 14/32  bone]
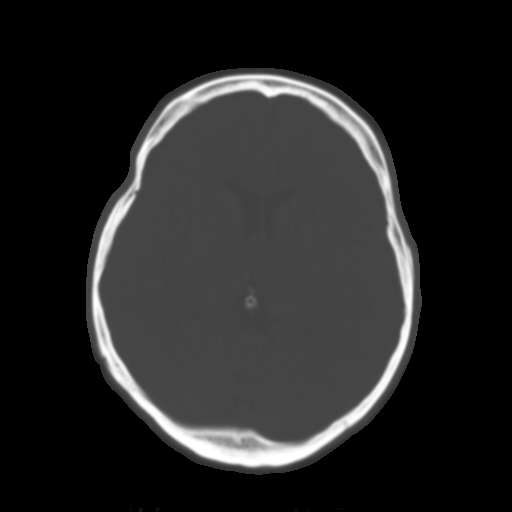
[im 18/32  brain]
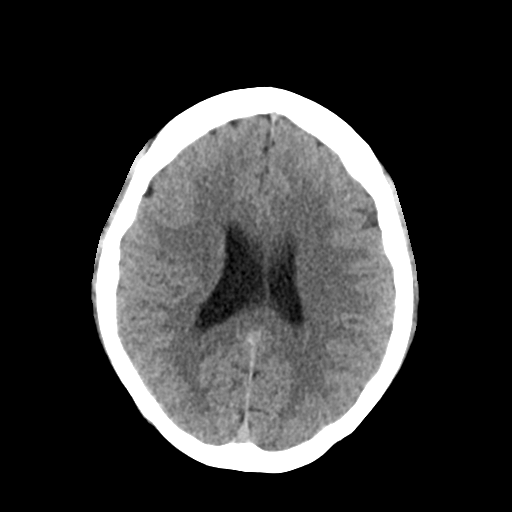
[im 21/32  brain]
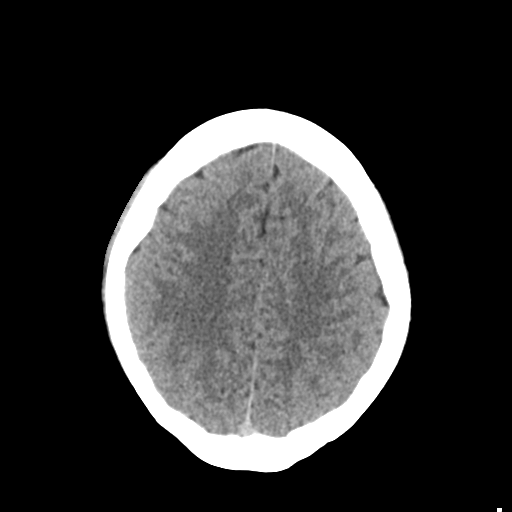
[im 24/32  brain]
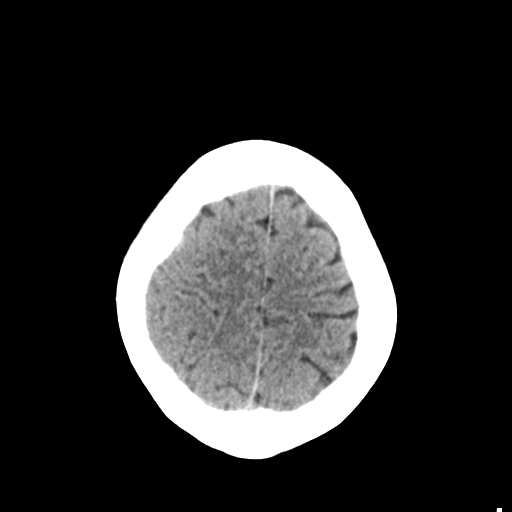
[im 26/32  brain]
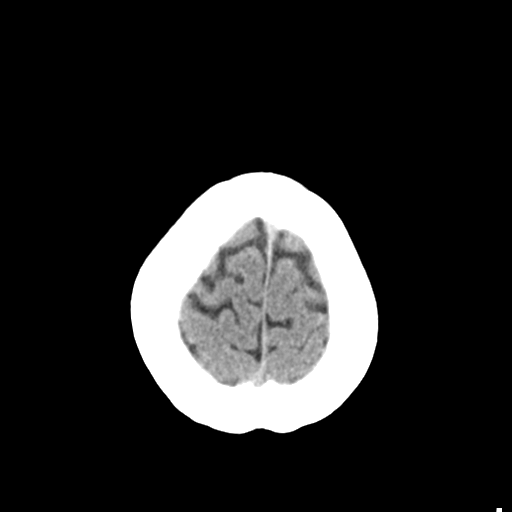
[im 26/32  bone]
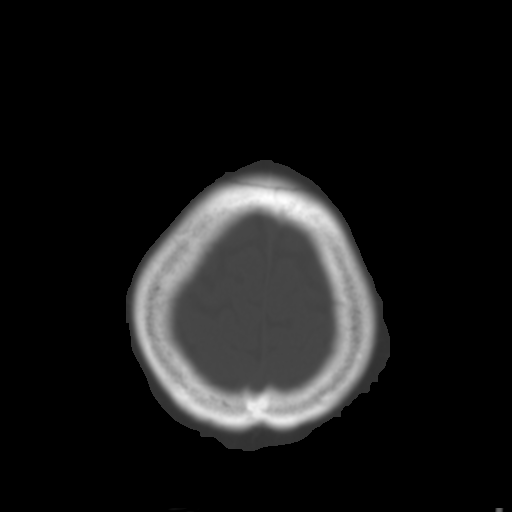
[im 29/32  brain]
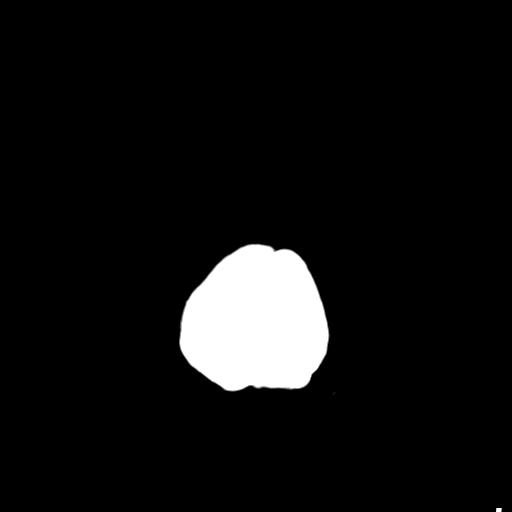

[Series 5: head 3.0 mpr cor · coronal · 0.32mm/px · 3 of 70 slices shown]
[im 24/70  brain]
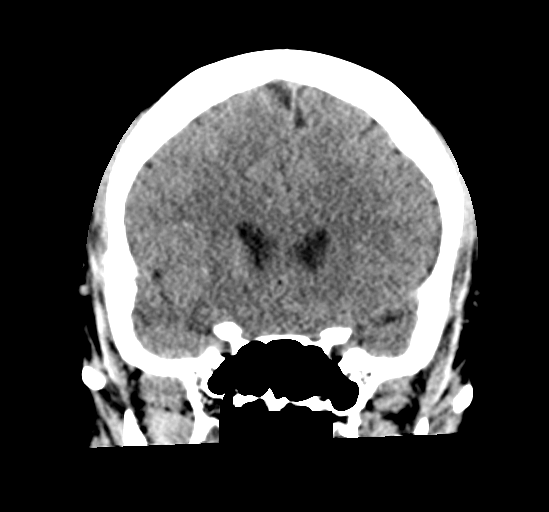
[im 31/70  brain]
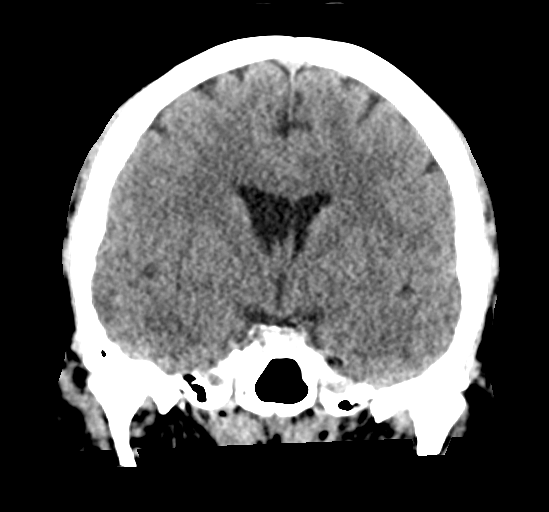
[im 39/70  brain]
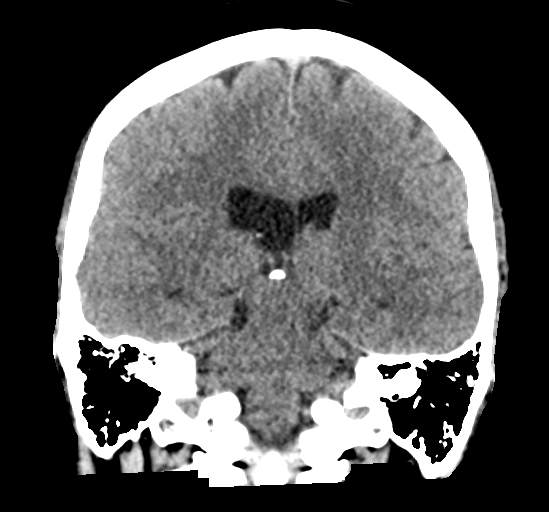

[Series 6: head 3.0 mpr sag · sagittal · 0.31mm/px · 3 of 61 slices shown]
[im 21/61  brain]
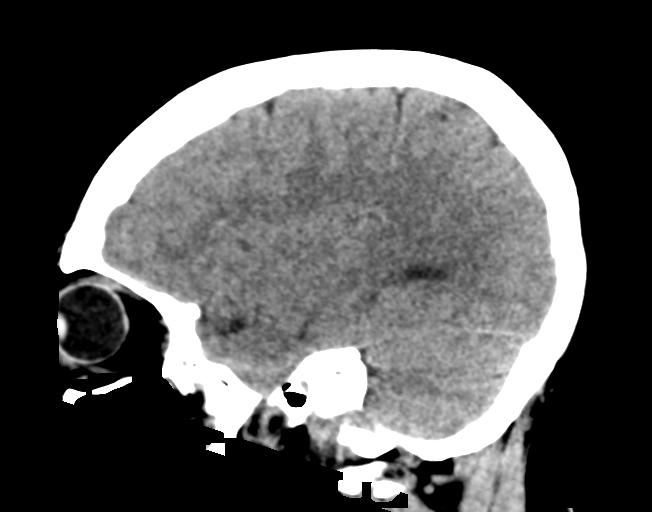
[im 31/61  brain]
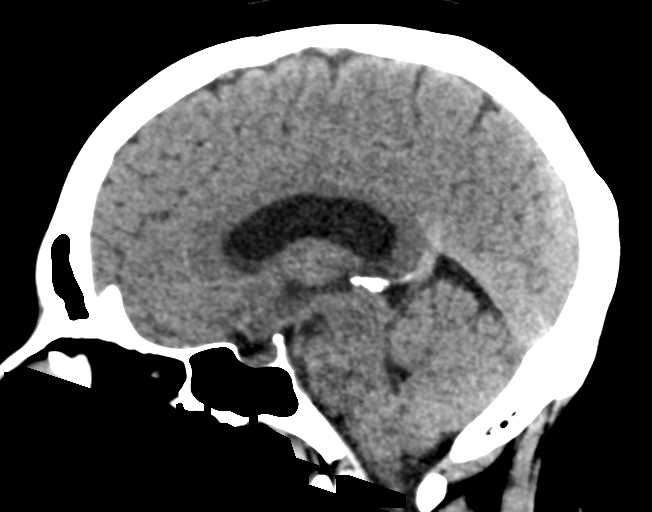
[im 41/61  brain]
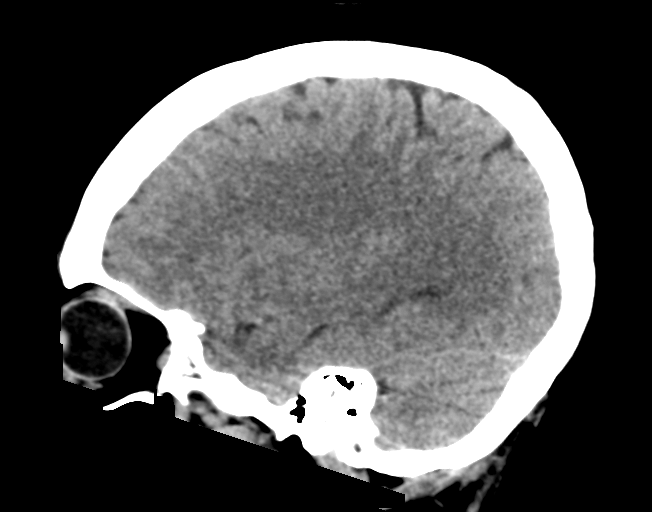

[16 of 47 positions shown; findings below may reference images not displayed]

FINDINGS: Brain: No evidence of acute infarction, hemorrhage, hydrocephalus,
extra-axial collection or mass lesion/mass effect.

Vascular: Negative for hyperdense vessel

Skull: Negative

Sinuses/Orbits: Negative

Other: None
IMPRESSION: Normal CT head

## 2022-09-01 ENCOUNTER — Other Ambulatory Visit (HOSPITAL_COMMUNITY): Payer: Self-pay

## 2023-02-10 ENCOUNTER — Other Ambulatory Visit (HOSPITAL_COMMUNITY): Payer: Self-pay

## 2023-07-29 ENCOUNTER — Emergency Department (HOSPITAL_COMMUNITY)
Admission: EM | Admit: 2023-07-29 | Discharge: 2023-07-30 | Disposition: A | Discharge status: Home / Self Care | Payer: Self-pay

## 2023-07-29 ENCOUNTER — Emergency Department (HOSPITAL_COMMUNITY): Payer: Self-pay

## 2023-07-29 ENCOUNTER — Encounter (HOSPITAL_COMMUNITY): Payer: Self-pay | Admitting: *Deleted

## 2023-07-29 ENCOUNTER — Other Ambulatory Visit: Payer: Self-pay

## 2023-07-29 DIAGNOSIS — S161XXA Strain of muscle, fascia and tendon at neck level, initial encounter: Secondary | ICD-10-CM | POA: Insufficient documentation

## 2023-07-29 DIAGNOSIS — T148XXA Other injury of unspecified body region, initial encounter: Secondary | ICD-10-CM

## 2023-07-29 DIAGNOSIS — E119 Type 2 diabetes mellitus without complications: Secondary | ICD-10-CM | POA: Insufficient documentation

## 2023-07-29 DIAGNOSIS — Z7984 Long term (current) use of oral hypoglycemic drugs: Secondary | ICD-10-CM | POA: Insufficient documentation

## 2023-07-29 DIAGNOSIS — R109 Unspecified abdominal pain: Secondary | ICD-10-CM | POA: Insufficient documentation

## 2023-07-29 DIAGNOSIS — R079 Chest pain, unspecified: Secondary | ICD-10-CM | POA: Insufficient documentation

## 2023-07-29 DIAGNOSIS — Z7901 Long term (current) use of anticoagulants: Secondary | ICD-10-CM | POA: Diagnosis not present

## 2023-07-29 DIAGNOSIS — R531 Weakness: Secondary | ICD-10-CM | POA: Insufficient documentation

## 2023-07-29 DIAGNOSIS — Z86711 Personal history of pulmonary embolism: Secondary | ICD-10-CM | POA: Diagnosis not present

## 2023-07-29 DIAGNOSIS — Z794 Long term (current) use of insulin: Secondary | ICD-10-CM | POA: Insufficient documentation

## 2023-07-29 DIAGNOSIS — S199XXA Unspecified injury of neck, initial encounter: Secondary | ICD-10-CM | POA: Diagnosis present

## 2023-07-29 DIAGNOSIS — Z79899 Other long term (current) drug therapy: Secondary | ICD-10-CM | POA: Insufficient documentation

## 2023-07-29 DIAGNOSIS — R202 Paresthesia of skin: Secondary | ICD-10-CM | POA: Diagnosis not present

## 2023-07-29 DIAGNOSIS — I10 Essential (primary) hypertension: Secondary | ICD-10-CM | POA: Diagnosis not present

## 2023-07-29 DIAGNOSIS — Y92410 Unspecified street and highway as the place of occurrence of the external cause: Secondary | ICD-10-CM | POA: Insufficient documentation

## 2023-07-29 HISTORY — DX: Fibromyalgia: M79.7

## 2023-07-29 HISTORY — DX: Schizoaffective disorder, bipolar type: F25.0

## 2023-07-29 LAB — CBC
HCT: 39 % (ref 36.0–46.0)
Hemoglobin: 12.9 g/dL (ref 12.0–15.0)
MCH: 30.1 pg (ref 26.0–34.0)
MCHC: 33.1 g/dL (ref 30.0–36.0)
MCV: 90.9 fL (ref 80.0–100.0)
Platelets: 406 10*3/uL — ABNORMAL HIGH (ref 150–400)
RBC: 4.29 MIL/uL (ref 3.87–5.11)
RDW: 13.7 % (ref 11.5–15.5)
WBC: 21.5 10*3/uL — ABNORMAL HIGH (ref 4.0–10.5)
nRBC: 0 % (ref 0.0–0.2)

## 2023-07-29 LAB — COMPREHENSIVE METABOLIC PANEL WITH GFR
ALT: 16 U/L (ref 0–44)
AST: 16 U/L (ref 15–41)
Albumin: 3 g/dL — ABNORMAL LOW (ref 3.5–5.0)
Alkaline Phosphatase: 70 U/L (ref 38–126)
Anion gap: 16 — ABNORMAL HIGH (ref 5–15)
BUN: 8 mg/dL (ref 6–20)
CO2: 24 mmol/L (ref 22–32)
Calcium: 9.4 mg/dL (ref 8.9–10.3)
Chloride: 98 mmol/L (ref 98–111)
Creatinine, Ser: 0.64 mg/dL (ref 0.44–1.00)
GFR, Estimated: >60 mL/min (ref >60)
Glucose, Bld: 357 mg/dL — ABNORMAL HIGH (ref 70–99)
Potassium: 3.5 mmol/L (ref 3.5–5.1)
Sodium: 138 mmol/L (ref 135–145)
Total Bilirubin: 0.4 mg/dL (ref 0.0–1.2)
Total Protein: 6.4 g/dL — ABNORMAL LOW (ref 6.5–8.1)

## 2023-07-29 LAB — I-STAT CHEM 8, ED
BUN: 7 mg/dL (ref 6–20)
Calcium, Ion: 1.11 mmol/L — ABNORMAL LOW (ref 1.15–1.40)
Chloride: 99 mmol/L (ref 98–111)
Creatinine, Ser: 0.6 mg/dL (ref 0.44–1.00)
Glucose, Bld: 364 mg/dL — ABNORMAL HIGH (ref 70–99)
HCT: 41 % (ref 36.0–46.0)
Hemoglobin: 13.9 g/dL (ref 12.0–15.0)
Potassium: 3.3 mmol/L — ABNORMAL LOW (ref 3.5–5.1)
Sodium: 137 mmol/L (ref 135–145)
TCO2: 25 mmol/L (ref 22–32)

## 2023-07-29 LAB — I-STAT CG4 LACTIC ACID, ED: Lactic Acid, Venous: 1.7 mmol/L (ref 0.5–1.9)

## 2023-07-29 LAB — PROTIME-INR
INR: 1 (ref 0.8–1.2)
Prothrombin Time: 13.2 s (ref 11.4–15.2)

## 2023-07-29 LAB — ETHANOL: Alcohol, Ethyl (B): <15 mg/dL (ref <15)

## 2023-07-29 LAB — SAMPLE TO BLOOD BANK

## 2023-07-29 MED ORDER — ONDANSETRON HCL 4 MG/2ML IJ SOLN: 4.0000 mg | Freq: Once | INTRAMUSCULAR | Status: DC

## 2023-07-29 MED ORDER — HYDROMORPHONE HCL 1 MG/ML IJ SOLN
0.5000 mg | Freq: Once | INTRAMUSCULAR | Status: AC
  Administered 2023-07-29: 0.5 mg via INTRAVENOUS
  Filled 2023-07-29: qty 1

## 2023-07-29 MED ORDER — ONDANSETRON HCL 4 MG/2ML IJ SOLN
4.0000 mg | Freq: Once | INTRAMUSCULAR | Status: AC
Start: 2023-07-29 — End: 2023-07-29
  Administered 2023-07-29: 4 mg via INTRAVENOUS
  Filled 2023-07-29: qty 2

## 2023-07-29 MED ORDER — IOHEXOL 350 MG/ML SOLN
75.0000 mL | Freq: Once | INTRAVENOUS | Status: AC | PRN
  Administered 2023-07-29: 75 mL via INTRAVENOUS

## 2023-07-29 MED ORDER — MORPHINE SULFATE (PF) 4 MG/ML IV SOLN
4.0000 mg | Freq: Once | INTRAVENOUS | Status: AC
  Administered 2023-07-29: 4 mg via INTRAVENOUS
  Filled 2023-07-29: qty 1
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
## 2023-07-29 NOTE — ED Notes (Signed)
 Trauma Response Nurse Documentation   Christy Velasquez is a 48 y.o. female arriving to [facility] ED via {Trauma ED/EMS:26864}  On {meds; anticoagulants:31417}. Trauma was activated as a {Trauma Level:26868} by *** based on the following trauma criteria {Trauma criteria:26865}.  Patient cleared for CT by Dr. Aaron Aas Pt transported to {TRN Radiology:26861::"CT"} with trauma response nurse present to monitor. RN remained with the patient throughout their absence from the department for clinical observation.   [facility] ***.  Trauma MD Arrival Time: ***.  History   Past Medical History:  Diagnosis Date   CAD (coronary artery disease), native coronary artery    Coronary CTA showed minimal CAD less than 25% in the proximal LAD.  Her calcium  score was 14.4.   Chronic back pain    Chronic foot pain    Chronic mid back pain    Diabetes mellitus without complication (HCC)    Fibromyalgia    Hypertension    Lupus    Migraines    Schizoaffective disorder, bipolar type (HCC)    Sciatica    Sickle cell anemia (HCC)      Past Surgical History:  Procedure Laterality Date   APPENDECTOMY     DILATION AND CURETTAGE, DIAGNOSTIC / THERAPEUTIC         Initial Focused Assessment (If applicable, or please see trauma documentation):   CT's Completed:   {Trauma CT:26866}   Interventions:   Plan for disposition:  {Trauma Dispo:26867}   Consults completed:  {Trauma Consults:26862} at ***.  Event Summary:  MTP Summary (If applicable):   Bedside handoff with {Trauma handoff:26863::"ED RN"} ***.    Brunetta Capes  Trauma Response RN  Please call TRN at [phone] for further assistance.

## 2023-07-30 ENCOUNTER — Emergency Department (HOSPITAL_COMMUNITY): Payer: Self-pay

## 2023-07-30 MED ORDER — METHOCARBAMOL 500 MG PO TABS: 500.0000 mg | ORAL_TABLET | Freq: Three times a day (TID) | ORAL | 0 refills | Status: AC | PRN

## 2023-07-30 MED ORDER — METHOCARBAMOL 500 MG PO TABS: 500.0000 mg | ORAL_TABLET | Freq: Three times a day (TID) | ORAL | 0 refills | Status: DC | PRN

## 2023-07-30 NOTE — ED Provider Notes (Signed)
 I assumed care of this patient from previous provider.  Please see their note for further details of history, exam, and MDM.   Pending MRIs C/T/L. Other than L5/S1 facet arthrosis, MRIs negative. Provided with CAM boot and crutches.  The patient appears reasonably screened and/or stabilized for discharge and I doubt any other medical condition or other [facility] requiring further screening, evaluation, or treatment in the ED at this time. I have discussed the findings, Dx and Tx plan with the patient/family who expressed understanding and agree(s) with the plan. Discharge instructions discussed at length. The patient/family was given strict return precautions who verbalized understanding of the instructions. No further questions at time of discharge.  Disposition: Discharge  Condition: Good  ED Discharge Orders          Ordered    methocarbamol  (ROBAXIN ) 500 MG tablet  Every 8 hours PRN        07/30/23 0232            Follow Up: Broderick Canning, MD [address] [place] [address] [phone]  Call  to schedule an appointment for close follow up  Audie Bleacher, MD [address] [place] [address] [phone]  Call  as needed         Lindle Rhea, MD 07/30/23 [phone]

## 2023-07-30 NOTE — Progress Notes (Signed)
 Orthopedic Tech Progress Note Patient Details:  Christy Velasquez April 23, 1975 [id number]  Ortho Devices Type of Ortho Device: CAM walker, Crutches Ortho Device/Splint Location: rle Ortho Device/Splint Interventions: Ordered, Application, Adjustment   Post Interventions Patient Tolerated: Well Instructions Provided: Care of device, Adjustment of device  Christy Velasquez 07/30/2023, 7:39 AM

## 2023-07-30 NOTE — ED Notes (Signed)
 AVS provided by edp was reviewed with pt. Pt verbalized understanding with no additional questions at this time. Pt wheelchair to car by NT. Pt going home with s/o at bedside

## 2023-07-30 NOTE — ED Notes (Signed)
 Pt transported to MRI

## 2023-07-30 NOTE — Discharge Instructions (Addendum)
You may use over-the-counter topical muscle creams such as SalonPas, Icy Hot, Bengay, etc. Please stretch, apply ice or heat (whichever helps), and have massage therapy for additional assistance.
